# Patient Record
Sex: Male | Born: 1935 | Race: White | Hispanic: No | Smoking: Former smoker
Health system: Southern US, Community
[De-identification: ages and names within clinical notes are randomized; demographics above are authoritative.]

## PROBLEM LIST (undated history)

## (undated) DIAGNOSIS — N200 Calculus of kidney: Secondary | ICD-10-CM

---

## 1958-06-03 DIAGNOSIS — S0990XA Unspecified injury of head, initial encounter: Secondary | ICD-10-CM

## 1958-06-03 HISTORY — DX: Unspecified injury of head, initial encounter: S09.90XA

## 2013-02-13 ENCOUNTER — Emergency Department: Payer: Self-pay | Admitting: Emergency Medicine

## 2013-02-13 LAB — COMPREHENSIVE METABOLIC PANEL
Alkaline Phosphatase: 95 U/L (ref 50–136)
Anion Gap: 6 — ABNORMAL LOW (ref 7–16)
BUN: 27 mg/dL — ABNORMAL HIGH (ref 7–18)
Chloride: 108 mmol/L — ABNORMAL HIGH (ref 98–107)
Co2: 22 mmol/L (ref 21–32)
Creatinine: 2.34 mg/dL — ABNORMAL HIGH (ref 0.60–1.30)
Glucose: 90 mg/dL (ref 65–99)
Osmolality: 277 (ref 275–301)
SGPT (ALT): 23 U/L (ref 12–78)
Sodium: 136 mmol/L (ref 136–145)

## 2013-02-13 LAB — URINALYSIS, COMPLETE
Nitrite: NEGATIVE
Ph: 5 (ref 4.5–8.0)
Protein: 30
Specific Gravity: 1.021 (ref 1.003–1.030)
Squamous Epithelial: NONE SEEN
WBC UR: 16 /HPF (ref 0–5)

## 2013-02-13 LAB — CBC
HGB: 14.5 g/dL (ref 13.0–18.0)
MCHC: 33.8 g/dL (ref 32.0–36.0)
MCV: 92 fL (ref 80–100)
Platelet: 201 10*3/uL (ref 150–440)
RDW: 12.7 % (ref 11.5–14.5)

## 2013-02-15 LAB — URINE CULTURE

## 2017-01-26 ENCOUNTER — Ambulatory Visit
Admission: EM | Admit: 2017-01-26 | Discharge: 2017-01-26 | Disposition: A | Discharge status: Home / Self Care | Payer: Medicare Other | Attending: Family Medicine | Admitting: Family Medicine

## 2017-01-26 ENCOUNTER — Ambulatory Visit (INDEPENDENT_AMBULATORY_CARE_PROVIDER_SITE_OTHER): Payer: Medicare Other

## 2017-01-26 DIAGNOSIS — R05 Cough: Secondary | ICD-10-CM

## 2017-01-26 DIAGNOSIS — J9801 Acute bronchospasm: Secondary | ICD-10-CM

## 2017-01-26 DIAGNOSIS — R059 Cough, unspecified: Secondary | ICD-10-CM

## 2017-01-26 HISTORY — DX: Calculus of kidney: N20.0

## 2017-01-26 MED ORDER — IPRATROPIUM-ALBUTEROL 0.5-2.5 (3) MG/3ML IN SOLN
3.0000 mL | Freq: Once | RESPIRATORY_TRACT | Status: AC
  Administered 2017-01-26: 3 mL via RESPIRATORY_TRACT

## 2017-01-26 MED ORDER — ALBUTEROL SULFATE HFA 108 (90 BASE) MCG/ACT IN AERS: 1.0000 | INHALATION_SPRAY | Freq: Four times a day (QID) | RESPIRATORY_TRACT | 1 refills | Status: DC | PRN

## 2017-01-26 MED ORDER — PREDNISONE 20 MG PO TABS: ORAL_TABLET | ORAL | 0 refills | Status: DC

## 2017-01-26 NOTE — ED Triage Notes (Signed)
Pt with one week of cough with "lots of phlegm" and feeling fatigued. Has been running a fever earlier in the week. Denies pain

## 2017-01-26 NOTE — ED Provider Notes (Signed)
[facility]    CSN: [id number] Arrival date & time: 01/26/17  1041     History   Chief Complaint Chief Complaint  Patient presents with  . Cough    HPI George Waller is a 83 y.o. male.   The history is provided by the patient.  Cough  Associated symptoms: fever and wheezing   URI  Presenting symptoms: congestion, cough and fever   Severity:  Moderate Onset quality:  Sudden Duration:  1 week Timing:  Constant Progression:  Worsening Chronicity:  New Relieved by:  Nothing Ineffective treatments:  OTC medications Associated symptoms: wheezing   Risk factors: being elderly   Risk factors: no chronic cardiac disease, no chronic kidney disease, no chronic respiratory disease, no diabetes mellitus, no immunosuppression, no recent illness, no recent travel and no sick contacts     Past Medical History:  Diagnosis Date  . Kidney stones     There are no active problems to display for this patient.   No past surgical history on file.     Home Medications    Prior to Admission medications   Medication Sig Start Date End Date Taking? Authorizing Provider  acetaminophen (TYLENOL) 500 MG tablet Take 500 mg by mouth every 6 (six) hours as needed.   Yes [provider]  albuterol (PROVENTIL HFA;VENTOLIN HFA) 108 (90 Base) MCG/ACT inhaler Inhale 1-2 puffs into the lungs every 6 (six) hours as needed for wheezing or shortness of breath. 01/26/17   Payton Mccallum, MD  predniSONE (DELTASONE) 20 MG tablet 3 tabs po once day 1, then 2 tabs po qd for 3 days, then 1 tab po qd for 3 days, then half a tab po qd for 2 days 01/26/17   Payton Mccallum, MD    Family History No family history on file.  Social History Social History  Substance Use Topics  . Smoking status: Not on file  . Smokeless tobacco: Not on file  . Alcohol use Not on file     Allergies   Codeine and Sulfa antibiotics   Review of Systems Review of Systems  Constitutional:  Positive for fever.  HENT: Positive for congestion.   Respiratory: Positive for cough and wheezing.      Physical Exam Triage Vital Signs ED Triage Vitals [01/26/17 1105]  Enc Vitals Group     BP (!) 198/78     Pulse Rate (!) 51     Resp 16     Temp 97.6 F (36.4 C)     Temp Source Oral     SpO2 97 %     Weight 172 lb (78 kg)     Height 5\' 9"  (1.753 m)     Head Circumference      Peak Flow      Pain Score      Pain Loc      Pain Edu?      Excl. in GC?    No data found.   Updated Vital Signs BP (!) 198/78 (BP Location: Right Arm)   Pulse (!) 51   Temp 97.6 F (36.4 C) (Oral)   Resp 16   Ht 5\' 9"  (1.753 m)   Wt 172 lb (78 kg)   SpO2 97%   BMI 25.40 kg/m   Visual Acuity Right Eye Distance:   Left Eye Distance:   Bilateral Distance:    Right Eye Near:   Left Eye Near:    Bilateral Near:     Physical Exam  Constitutional: He appears well-developed and well-nourished. No distress.  HENT:  Head: Normocephalic and atraumatic.  Right Ear: Tympanic membrane, external ear and ear canal normal.  Left Ear: Tympanic membrane, external ear and ear canal normal.  Nose: Nose normal.  Mouth/Throat: Uvula is midline, oropharynx is clear and moist and mucous membranes are normal. No oropharyngeal exudate or tonsillar abscesses.  Eyes: Pupils are equal, round, and reactive to light. Conjunctivae and EOM are normal. Right eye exhibits no discharge. Left eye exhibits no discharge. No scleral icterus.  Neck: Normal range of motion. Neck supple. No tracheal deviation present. No thyromegaly present.  Cardiovascular: Normal rate, regular rhythm and normal heart sounds.   Pulmonary/Chest: Effort normal. No stridor. No respiratory distress. He has wheezes (diffuse, bilateral). He has no rales. He exhibits no tenderness.  Lymphadenopathy:    He has no cervical adenopathy.  Neurological: He is alert.  Skin: Skin is warm and dry. No rash noted. He is not diaphoretic.  Nursing note  and vitals reviewed.    UC Treatments / Results  Labs (all labs ordered are listed, but only abnormal results are displayed) Labs Reviewed - No data to display  EKG  EKG Interpretation None       Radiology Dg Chest 2 View  Result Date: 01/26/2017 CLINICAL DATA:  Cough for 1 week. EXAM: CHEST  2 VIEW COMPARISON:  None. FINDINGS: Lungs are clear. No focal airspace disease or pulmonary edema. Heart and mediastinum are within normal limits. Bridging osteophytes throughout the thoracic spine. No large pleural effusions. Minimal vertebral body height loss in a mid thoracic vertebral body. IMPRESSION: No active cardiopulmonary disease. Mild compression deformity in mid thoracic spine of unknown age. Electronically Signed   By: Richarda Overlie M.D.   On: 01/26/2017 11:57    Procedures Procedures (including critical care time)  Medications Ordered in UC Medications  ipratropium-albuterol (DUONEB) 0.5-2.5 (3) MG/3ML nebulizer solution 3 mL (3 mLs Nebulization Given 01/26/17 1134)     Initial Impression / Assessment and Plan / UC Course  I have reviewed the triage vital signs and the nursing notes.  Pertinent labs & imaging results that were available during my care of the patient were reviewed by me and considered in my medical decision making (see chart for details).       Final Clinical Impressions(s) / UC Diagnoses   Final diagnoses:  Cough  Bronchospasm    New Prescriptions Discharge Medication List as of 01/26/2017 12:09 PM    START taking these medications   Details  albuterol (PROVENTIL HFA;VENTOLIN HFA) 108 (90 Base) MCG/ACT inhaler Inhale 1-2 puffs into the lungs every 6 (six) hours as needed for wheezing or shortness of breath., Starting Sun 01/26/2017, Normal    predniSONE (DELTASONE) 20 MG tablet 3 tabs po once day 1, then 2 tabs po qd for 3 days, then 1 tab po qd for 3 days, then half a tab po qd for 2 days, Normal      1. x-ray result (negative chest x-ray) and  diagnosis reviewed with patient and wife; given duoneb tx x 1 with improvement of symptoms 2. rx as per orders above; reviewed possible side effects, interactions, risks and benefits  3. Recommend supportive treatment with rest, fluids 4. Follow-up prn if symptoms worsen or don't improve  Controlled Substance Prescriptions [place] Controlled Substance Registry consulted? Not Applicable   Payton Mccallum, MD 01/26/17 [phone]

## 2018-05-29 ENCOUNTER — Ambulatory Visit
Admission: EM | Admit: 2018-05-29 | Discharge: 2018-05-29 | Disposition: A | Discharge status: Home / Self Care | Payer: Medicare Other | Attending: Family Medicine | Admitting: Family Medicine

## 2018-05-29 ENCOUNTER — Ambulatory Visit (INDEPENDENT_AMBULATORY_CARE_PROVIDER_SITE_OTHER): Payer: Medicare Other

## 2018-05-29 DIAGNOSIS — M25562 Pain in left knee: Secondary | ICD-10-CM

## 2018-05-29 DIAGNOSIS — S82035A Nondisplaced transverse fracture of left patella, initial encounter for closed fracture: Secondary | ICD-10-CM | POA: Diagnosis not present

## 2018-05-29 DIAGNOSIS — M25462 Effusion, left knee: Secondary | ICD-10-CM

## 2018-05-29 DIAGNOSIS — W19XXXA Unspecified fall, initial encounter: Secondary | ICD-10-CM | POA: Diagnosis not present

## 2018-05-29 DIAGNOSIS — R030 Elevated blood-pressure reading, without diagnosis of hypertension: Secondary | ICD-10-CM

## 2018-05-29 MED ORDER — IBUPROFEN 800 MG PO TABS: 800.0000 mg | ORAL_TABLET | Freq: Three times a day (TID) | ORAL | 0 refills | Status: DC

## 2018-05-29 NOTE — ED Triage Notes (Signed)
Pt missed a step yesterday and fell on the road on his left knee now having pain and swelling. Did take a BC. Pain gets worse with pressure.

## 2018-05-29 NOTE — Discharge Instructions (Addendum)
Keep immobilizer on until you are evaluated by orthopedics. You should go to [facility] today. The urgent care opens at 1 pm. Take CD and radiology report with you.

## 2018-05-29 NOTE — ED Provider Notes (Signed)
[facility]    CSN: [id number] Arrival date & time: 05/29/18  1027     History   Chief Complaint Chief Complaint  Patient presents with  . Fall    HPI George CrockerFranklin D Waller is a 84 y.o. male.   History of Present Illness  George Waller is a 84 y.o. male that complains of pain in the anterior aspect of the left knee without radiation after a fall on the evening of 05/28/18. Patient was walking, loss his balance and fell on the concrete landing on the left knee. Patient describes pain as aching and throbbing. Pain severity now is 7 /10. Pain is aggravated by movement and weight bearing. Pain is alleviated by nothing. Symptoms associated with pain include the inability to bear weight on the LLE. He denies any numbness, tingling, weakness, loss of sensation or loss of motion. The patient denies other injuries.  Care prior to arrival consisted of nothing.             Past Medical History:  Diagnosis Date  . Kidney stones     There are no active problems to display for this patient.   History reviewed. No pertinent surgical history.     Home Medications    Prior to Admission medications   Medication Sig Start Date End Date Taking? Authorizing Provider  acetaminophen (TYLENOL) 500 MG tablet Take 500 mg by mouth every 6 (six) hours as needed.    [provider]  albuterol (PROVENTIL HFA;VENTOLIN HFA) 108 (90 Base) MCG/ACT inhaler Inhale 1-2 puffs into the lungs every 6 (six) hours as needed for wheezing or shortness of breath. 01/26/17   George Waller, [place], MD  ibuprofen (ADVIL,MOTRIN) 800 MG tablet Take 1 tablet (800 mg total) by mouth 3 (three) times daily. 05/29/18   George Waller, Korinna Tat, FNP  predniSONE (DELTASONE) 20 MG tablet 3 tabs po once day 1, then 2 tabs po qd for 3 days, then 1 tab po qd for 3 days, then half a tab po qd for 2 days 01/26/17   George Waller, [place], MD    Family History Family History  Problem Relation Age of Onset  . Cancer Mother    . Thyroid disease Mother   . Diabetes Mother   . Cancer Father     Social History Social History   Tobacco Use  . Smoking status: Never Smoker  . Smokeless tobacco: Never Used  Substance Use Topics  . Alcohol use: Never    Frequency: Never  . Drug use: Never     Allergies   Codeine and Sulfa antibiotics   Review of Systems Review of Systems  Constitutional: Negative.   Respiratory: Negative.   Cardiovascular: Negative.   Musculoskeletal: Positive for gait problem. Negative for back pain and neck pain.       Left knee pain and swelling   Neurological: Negative for dizziness.  All other systems reviewed and are negative.    Physical Exam Triage Vital Signs ED Triage Vitals  Enc Vitals Group     BP 05/29/18 1052 (!) 188/105     Pulse Rate 05/29/18 1052 (!) 54     Resp 05/29/18 1052 18     Temp 05/29/18 1052 (!) 97.5 F (36.4 C)     Temp Source 05/29/18 1052 Oral     SpO2 05/29/18 1052 97 %     Weight 05/29/18 1055 175 lb (79.4 kg)     Height 05/29/18 1055 5\' 9"  (1.753 m)     Head  Circumference --      Peak Flow --      Pain Score 05/29/18 1054 7     Pain Loc --      Pain Edu? --      Excl. in GC? --    No data found.  Updated Vital Signs BP (!) 175/101 (BP Location: Left Arm)   Pulse (!) 53   Temp (!) 97.5 F (36.4 C) (Oral)   Resp 18   Ht 5\' 9"  (1.753 m)   Wt 175 lb (79.4 kg)   SpO2 97%   BMI 25.84 kg/m   Visual Acuity Right Eye Distance:   Left Eye Distance:   Bilateral Distance:    Right Eye Near:   Left Eye Near:    Bilateral Near:     Physical Exam Vitals signs reviewed.  Constitutional:      Appearance: Normal appearance.  HENT:     Head: Normocephalic.  Neck:     Musculoskeletal: Normal range of motion and neck supple. No muscular tenderness.  Cardiovascular:     Rate and Rhythm: Normal rate.     Pulses: Normal pulses.  Pulmonary:     Effort: Pulmonary effort is normal.     Breath sounds: Normal breath sounds.    Musculoskeletal:     Left knee: He exhibits decreased range of motion, swelling and effusion. He exhibits no deformity and no erythema. Tenderness found.  Skin:    General: Skin is warm and dry.     Findings: No bruising.       Neurological:     General: No focal deficit present.     Mental Status: He is alert and oriented to person, place, and time.  Psychiatric:        Mood and Affect: Mood normal.        Behavior: Behavior normal.      UC Treatments / Results  Labs (all labs ordered are listed, but only abnormal results are displayed) Labs Reviewed - No data to display  EKG None  Radiology Dg Knee Complete 4 Views Left  Result Date: 05/29/2018 CLINICAL DATA:  The patient suffered a blow to the left knee last night when he fell on stairs. Initial encounter. EXAM: LEFT KNEE - COMPLETE 4+ VIEW COMPARISON:  None. FINDINGS: The patient has a transverse fracture through the mid pole of the patella. Fracture fragments are slightly distracted. No other fracture is identified. Marked soft tissue swelling anterior to the patella is noted. The patient also has a large joint effusion. Mild degenerative change is present about the knee. IMPRESSION: Minimally distracted transverse fracture mid pole of the patella. Large prepatellar soft tissue swelling and large knee joint effusion. Mild osteoarthritis about the knee. Electronically Signed   By: Drusilla Kannerhomas  Dalessio M.D.   On: 05/29/2018 11:31    Procedures Procedures (including critical care time)  Medications Ordered in UC Medications - No data to display  Initial Impression / Assessment and Plan / UC Course  I have reviewed the triage vital signs and the nursing notes.  Pertinent labs & imaging results that were available during my care of the patient were reviewed by me and considered in my medical decision making (see chart for details).     84 yo male presenting with left knee pain s/p mechanical fall on 05/28/18. X-ray reveals  minimally distracted transverse fracture mid pole of the patella with large prepatellar soft tissue swelling and large knee joint effusion. Patient placed in knee immobilizer. Instructed  to go to [facility] today for further evaluation and management. Nonweightbearing until ortho follow-up. CD of image and radiology report given to patient/wife to take with them to orthopedics. Patient was also noted to have an elevated blood pressure. No history of hypertension. Patient denies chest pain, shortness of breath, dizziness, headache or blurred vision. Advised to follow-up with PCP in 3 days for BP recheck. Discussed indications for immediate ED follow-up regarding elevated BP.   Today's evaluation has revealed no signs of a dangerous process. Discussed diagnosis with patient. Patient aware of their diagnosis, possible red flag symptoms to watch out for and need for close follow up. Patient understands verbal and written discharge instructions. Patient comfortable with plan and disposition.  Patient has a clear mental status at this time, good insight into illness (after discussion and teaching) and has clear judgment to make decisions regarding their care.  Documentation was completed with the aid of voice recognition software. Transcription may contain typographical errors.  Final Clinical Impressions(s) / UC Diagnoses   Final diagnoses:  Closed nondisplaced transverse fracture of left patella, initial encounter  Effusion of left knee  Fall, initial encounter  Elevated BP without diagnosis of hypertension     Discharge Instructions     Keep immobilizer on until you are evaluated by orthopedics. You should go to [facility] today. The urgent care opens at 1 pm. Take CD and radiology report with you.      ED Prescriptions    Medication Sig Dispense Auth. Provider   ibuprofen (ADVIL,MOTRIN) 800 MG tablet Take 1 tablet (800 mg total) by mouth 3 (three) times daily. 21 tablet George Idol, FNP     Controlled Substance Prescriptions [place] Controlled Substance Registry consulted? Not Applicable   George Idol, [place] 05/29/18 1202

## 2019-08-21 ENCOUNTER — Ambulatory Visit: Payer: Medicare Other | Attending: Internal Medicine

## 2019-08-21 DIAGNOSIS — Z23 Encounter for immunization: Secondary | ICD-10-CM

## 2019-08-21 NOTE — Progress Notes (Signed)
   Covid-19 Vaccination Clinic  Name:  Rhyland Hinderliter    MRN: [id number] DOB: 03-09-1933  08/21/2019  Mr. Hemenway was observed post Covid-19 immunization for 15 minutes without incident. He was provided with Vaccine Information Sheet and instruction to access the V-Safe system.   Mr. Roam was instructed to call 911 with any severe reactions post vaccine: Marland Kitchen Difficulty breathing  . Swelling of face and throat  . A fast heartbeat  . A bad rash all over body  . Dizziness and weakness   Immunizations Administered    Name Date Dose VIS Date Route   Pfizer COVID-19 Vaccine 08/21/2019  1:02 PM 0.3 mL 05/14/2019 Intramuscular   Manufacturer: [employer], [employer]   Lot: [id number]   NDC: [address]-3

## 2019-09-11 ENCOUNTER — Ambulatory Visit: Payer: Medicare Other | Attending: Internal Medicine

## 2019-09-11 DIAGNOSIS — Z23 Encounter for immunization: Secondary | ICD-10-CM

## 2019-09-11 NOTE — Progress Notes (Signed)
   Covid-19 Vaccination Clinic  Name:  George Waller    MRN: [id number] DOB: 1933-05-02  09/11/2019  George Waller was observed post Covid-19 immunization for 15 minutes without incident. He was provided with Vaccine Information Sheet and instruction to access the V-Safe system.   George Waller was instructed to call 911 with any severe reactions post vaccine: Marland Kitchen Difficulty breathing  . Swelling of face and throat  . A fast heartbeat  . A bad rash all over body  . Dizziness and weakness   Immunizations Administered    Name Date Dose VIS Date Route   Pfizer COVID-19 Vaccine 09/11/2019  1:28 PM 0.3 mL 05/14/2019 Intramuscular   Manufacturer: [employer], [employer]   Lot: [phone] [id number]   NDC: [address]-6

## 2021-04-04 ENCOUNTER — Other Ambulatory Visit: Payer: Self-pay

## 2021-04-04 ENCOUNTER — Ambulatory Visit: Payer: Medicare Other | Admitting: Urology

## 2021-04-04 ENCOUNTER — Encounter: Payer: Self-pay | Admitting: Urology

## 2021-04-04 VITALS — BP 130/70 | HR 72 | Ht 68.0 in | Wt 175.0 lb

## 2021-04-04 DIAGNOSIS — N401 Enlarged prostate with lower urinary tract symptoms: Secondary | ICD-10-CM | POA: Diagnosis not present

## 2021-04-04 DIAGNOSIS — R351 Nocturia: Secondary | ICD-10-CM | POA: Diagnosis not present

## 2021-04-04 DIAGNOSIS — R350 Frequency of micturition: Secondary | ICD-10-CM | POA: Diagnosis not present

## 2021-04-04 LAB — URINALYSIS, COMPLETE
Bilirubin, UA: NEGATIVE
Ketones, UA: NEGATIVE
Leukocytes,UA: NEGATIVE
Nitrite, UA: NEGATIVE
Protein,UA: NEGATIVE
RBC, UA: NEGATIVE
Specific Gravity, UA: 1.025 (ref 1.005–1.030)
Urobilinogen, Ur: 0.2 mg/dL (ref 0.2–1.0)
pH, UA: 5.5 (ref 5.0–7.5)

## 2021-04-04 LAB — BLADDER SCAN AMB NON-IMAGING: Scan Result: 12

## 2021-04-04 MED ORDER — SILODOSIN 8 MG PO CAPS: 8.0000 mg | ORAL_CAPSULE | Freq: Every day | ORAL | 0 refills | Status: DC

## 2021-04-04 NOTE — Progress Notes (Addendum)
   04/04/2021 9:36 AM   [name] 1932-06-09 [id number]  Referring provider: No referring provider defined for this encounter.  Chief Complaint  Patient presents with   Urinary Frequency    HPI: George Waller is an 87 y.o. male self-referred for evaluation of urinary frequency.  Duration ~ 2 years, progressively worsening Most bothersome symptom is nocturia x4 He also has daytime frequency; no bothersome obstructive symptoms IPSS 12/35 Denies history sleep apnea or snoring Denies dysuria, gross hematuria No history UTI Denies flank, abdominal or pelvic pain     PMH: Past Medical History:  Diagnosis Date   Kidney stones     Surgical History: No past surgical history on file.  Home Medications:  Allergies as of 04/04/2021       Reactions   Codeine Swelling   Sulfa Antibiotics Itching        Medication List        Accurate as of April 04, 2021  9:36 AM. If you have any questions, ask your nurse or doctor.          STOP taking these medications    ibuprofen 800 MG tablet Commonly known as: ADVIL Stopped by: Riki Altes, MD   predniSONE 20 MG tablet Commonly known as: DELTASONE Stopped by: Riki Altes, MD       TAKE these medications    acetaminophen 500 MG tablet Commonly known as: TYLENOL Take 500 mg by mouth every 6 (six) hours as needed.   albuterol 108 (90 Base) MCG/ACT inhaler Commonly known as: VENTOLIN HFA Inhale 1-2 puffs into the lungs every 6 (six) hours as needed for wheezing or shortness of breath.        Allergies:  Allergies  Allergen Reactions   Codeine Swelling   Sulfa Antibiotics Itching    Family History: Family History  Problem Relation Age of Onset   Cancer Mother    Thyroid disease Mother    Diabetes Mother    Cancer Father     Social History:  reports that he has never smoked. He has never used smokeless tobacco. He reports that he does not drink alcohol and does not use  drugs.   Physical Exam: BP 130/70   Pulse 72   Ht 5\' 8"  (1.727 m)   Wt 175 lb (79.4 kg)   BMI 26.61 kg/m   Constitutional:  Alert and oriented, No acute distress. HEENT: [place] AT, moist mucus membranes.  Trachea midline, no masses. Cardiovascular: No clubbing, cyanosis, or edema. Respiratory: Normal respiratory effort, no increased work of breathing. GU: Reducible left inguinal hernia.  Prostate 60 g, smooth without nodules Skin: No rashes, bruises or suspicious lesions. Neurologic: Grossly intact, no focal deficits, moving all 4 extremities. Psychiatric: Normal mood and affect.   Assessment & Plan:    1.  BPH with urinary frequency Urinalysis today clear Bladder scan PVR 12 mL Symptoms modest enough that he desires a trial of medical management Initial trial silodosin 8 mg daily He indicated he would call back regarding efficacy and we will have a follow-up visit If no improvement recommend trial of a beta 3 agonist  2.  Nocturia As above Other etiologies of nocturia were discussed including sleep apnea and nocturnal polyuria   , MD  [facility] [address] [place], [place] [phone]

## 2021-05-01 ENCOUNTER — Other Ambulatory Visit: Payer: Self-pay | Admitting: Urology

## 2021-05-17 ENCOUNTER — Ambulatory Visit: Payer: Medicare Other | Admitting: Urology

## 2021-06-04 ENCOUNTER — Other Ambulatory Visit: Payer: Self-pay | Admitting: Urology

## 2021-06-15 ENCOUNTER — Other Ambulatory Visit: Payer: Self-pay

## 2021-06-15 ENCOUNTER — Encounter: Payer: Self-pay | Admitting: Urology

## 2021-06-15 ENCOUNTER — Ambulatory Visit: Payer: Medicare Other | Admitting: Urology

## 2021-06-15 VITALS — BP 165/67 | HR 60 | Wt 165.0 lb

## 2021-06-15 DIAGNOSIS — R350 Frequency of micturition: Secondary | ICD-10-CM | POA: Diagnosis not present

## 2021-06-15 DIAGNOSIS — R351 Nocturia: Secondary | ICD-10-CM

## 2021-06-15 DIAGNOSIS — R399 Unspecified symptoms and signs involving the genitourinary system: Secondary | ICD-10-CM | POA: Diagnosis not present

## 2021-06-15 LAB — MICROSCOPIC EXAMINATION
Bacteria, UA: NONE SEEN
Epithelial Cells (non renal): NONE SEEN /hpf (ref 0–10)
RBC, Urine: NONE SEEN /hpf (ref 0–2)

## 2021-06-15 LAB — URINALYSIS, COMPLETE
Bilirubin, UA: NEGATIVE
Glucose, UA: NEGATIVE
Ketones, UA: NEGATIVE
Leukocytes,UA: NEGATIVE
Nitrite, UA: NEGATIVE
Protein,UA: NEGATIVE
RBC, UA: NEGATIVE
Specific Gravity, UA: 1.025 (ref 1.005–1.030)
Urobilinogen, Ur: 1 mg/dL (ref 0.2–1.0)
pH, UA: 5 (ref 5.0–7.5)

## 2021-06-15 LAB — BLADDER SCAN AMB NON-IMAGING

## 2021-06-15 MED ORDER — MIRABEGRON ER 50 MG PO TB24: 50.0000 mg | ORAL_TABLET | Freq: Every day | ORAL | 0 refills | Status: DC

## 2021-06-15 NOTE — Progress Notes (Signed)
° °  06/15/2021 11:36 AM   George Waller August 10, 1932 [id number]  Referring provider: No referring provider defined for this encounter.  Chief Complaint  Patient presents with   Urinary Frequency    HPI: 88 y.o. male who presents for follow-up visit.  Initially seen 04/04/2021 with complaints of nocturia x4, daytime frequency Trial silodosin 8 mg daily No significant improvement on slow dosing.  Daytime frequency at every 2 hours.  Noted some initial improvement in his nocturia down to 2 times nightly but then worsened and currently at 3-4 No dysuria or gross hematuria Denies flank, abdominal or pelvic pain   PMH: Past Medical History:  Diagnosis Date   Kidney stones     Surgical History: No past surgical history on file.  Home Medications:  Allergies as of 06/15/2021       Reactions   Codeine Swelling   Sulfa Antibiotics Itching        Medication List        Accurate as of June 15, 2021 11:36 AM. If you have any questions, ask your nurse or doctor.          acetaminophen 500 MG tablet Commonly known as: TYLENOL Take 500 mg by mouth every 6 (six) hours as needed.   albuterol 108 (90 Base) MCG/ACT inhaler Commonly known as: VENTOLIN HFA Inhale 1-2 puffs into the lungs every 6 (six) hours as needed for wheezing or shortness of breath.   silodosin 8 MG Caps capsule Commonly known as: RAPAFLO TAKE 1 CAPSULE BY MOUTH DAILY WITH BREAKFAST.        Allergies:  Allergies  Allergen Reactions   Codeine Swelling   Sulfa Antibiotics Itching    Family History: Family History  Problem Relation Age of Onset   Cancer Mother    Thyroid disease Mother    Diabetes Mother    Cancer Father     Social History:  reports that he has never smoked. He has never used smokeless tobacco. He reports that he does not drink alcohol and does not use drugs.   Physical Exam: BP (!) 165/67    Pulse 60    Wt 165 lb (74.8 kg)    BMI 25.09 kg/m    Constitutional:  Alert and oriented, No acute distress. Psychiatric: Normal mood and affect.  Laboratory Data:  Urinalysis Dipstick/microscopy negative   Assessment & Plan:    1.  Lower urinary tract symptoms Bothersome storage related voiding symptoms No significant improvement on silodosin Bladder scan PVR 0 mL Trial Myrbetriq 50 mg daily Call back 4 weeks regarding efficacy   Abbie Sons, [place] [address], [place],  [address] [phone]

## 2021-07-12 ENCOUNTER — Other Ambulatory Visit: Payer: Self-pay

## 2021-07-12 MED ORDER — MIRABEGRON ER 50 MG PO TB24: 50.0000 mg | ORAL_TABLET | Freq: Every day | ORAL | 0 refills | Status: DC

## 2021-07-12 NOTE — Telephone Encounter (Signed)
Pt called stating myrbetriq 50 mg samples are helping and would like a prescription sent to pharmacy. Medication was sent to [facility]. OK per Dr. Lonna Cobb.

## 2021-07-16 ENCOUNTER — Telehealth: Payer: Self-pay

## 2021-07-16 DIAGNOSIS — R350 Frequency of micturition: Secondary | ICD-10-CM

## 2021-07-16 MED ORDER — MIRABEGRON ER 50 MG PO TB24: 50.0000 mg | ORAL_TABLET | Freq: Every day | ORAL | 11 refills | Status: DC

## 2021-07-16 NOTE — Telephone Encounter (Signed)
Incoming call from pt's spouse stating she was expecting RX to be sent in for Myrbetriq. It appears RX was sent as sample, RX changed to normal class and sent to [facility] in [place].

## 2021-09-01 DIAGNOSIS — G459 Transient cerebral ischemic attack, unspecified: Secondary | ICD-10-CM

## 2021-09-01 HISTORY — DX: Transient cerebral ischemic attack, unspecified: G45.9

## 2021-09-03 ENCOUNTER — Observation Stay
Admission: EM | Admit: 2021-09-03 | Discharge: 2021-09-04 | Disposition: A | Discharge status: Home / Self Care | Payer: Medicare Other | Attending: Family Medicine | Admitting: Family Medicine

## 2021-09-03 ENCOUNTER — Other Ambulatory Visit: Payer: Self-pay

## 2021-09-03 ENCOUNTER — Encounter: Payer: Self-pay | Admitting: Emergency Medicine

## 2021-09-03 ENCOUNTER — Emergency Department: Payer: Medicare Other

## 2021-09-03 DIAGNOSIS — R2681 Unsteadiness on feet: Secondary | ICD-10-CM | POA: Insufficient documentation

## 2021-09-03 DIAGNOSIS — Z79899 Other long term (current) drug therapy: Secondary | ICD-10-CM | POA: Diagnosis not present

## 2021-09-03 DIAGNOSIS — I6782 Cerebral ischemia: Secondary | ICD-10-CM | POA: Diagnosis not present

## 2021-09-03 DIAGNOSIS — R2689 Other abnormalities of gait and mobility: Secondary | ICD-10-CM | POA: Diagnosis present

## 2021-09-03 DIAGNOSIS — R269 Unspecified abnormalities of gait and mobility: Secondary | ICD-10-CM

## 2021-09-03 DIAGNOSIS — N1831 Chronic kidney disease, stage 3a: Secondary | ICD-10-CM | POA: Diagnosis not present

## 2021-09-03 DIAGNOSIS — Z87442 Personal history of urinary calculi: Secondary | ICD-10-CM | POA: Diagnosis not present

## 2021-09-03 DIAGNOSIS — I517 Cardiomegaly: Secondary | ICD-10-CM | POA: Insufficient documentation

## 2021-09-03 DIAGNOSIS — I639 Cerebral infarction, unspecified: Secondary | ICD-10-CM

## 2021-09-03 DIAGNOSIS — H532 Diplopia: Secondary | ICD-10-CM | POA: Diagnosis present

## 2021-09-03 DIAGNOSIS — R42 Dizziness and giddiness: Secondary | ICD-10-CM | POA: Insufficient documentation

## 2021-09-03 DIAGNOSIS — N179 Acute kidney failure, unspecified: Secondary | ICD-10-CM | POA: Diagnosis present

## 2021-09-03 LAB — COMPREHENSIVE METABOLIC PANEL
ALT: 17 U/L (ref 0–44)
AST: 19 U/L (ref 15–41)
Albumin: 3.9 g/dL (ref 3.5–5.0)
Alkaline Phosphatase: 93 U/L (ref 38–126)
Anion gap: 6 (ref 5–15)
BUN: 24 mg/dL — ABNORMAL HIGH (ref 8–23)
CO2: 25 mmol/L (ref 22–32)
Calcium: 9.1 mg/dL (ref 8.9–10.3)
Chloride: 109 mmol/L (ref 98–111)
Creatinine, Ser: 1.39 mg/dL — ABNORMAL HIGH (ref 0.61–1.24)
GFR, Estimated: 49 mL/min — ABNORMAL LOW (ref >60)
Glucose, Bld: 110 mg/dL — ABNORMAL HIGH (ref 70–99)
Potassium: 4.2 mmol/L (ref 3.5–5.1)
Sodium: 140 mmol/L (ref 135–145)
Total Bilirubin: 0.7 mg/dL (ref 0.3–1.2)
Total Protein: 7.7 g/dL (ref 6.5–8.1)

## 2021-09-03 LAB — DIFFERENTIAL
Abs Immature Granulocytes: 0.02 10*3/uL (ref 0.00–0.07)
Basophils Absolute: 0.1 10*3/uL (ref 0.0–0.1)
Basophils Relative: 1 %
Eosinophils Absolute: 1.1 10*3/uL — ABNORMAL HIGH (ref 0.0–0.5)
Eosinophils Relative: 14 %
Immature Granulocytes: 0 %
Lymphocytes Relative: 29 %
Lymphs Abs: 2.3 10*3/uL (ref 0.7–4.0)
Monocytes Absolute: 0.6 10*3/uL (ref 0.1–1.0)
Monocytes Relative: 7 %
Neutro Abs: 3.8 10*3/uL (ref 1.7–7.7)
Neutrophils Relative %: 49 %

## 2021-09-03 LAB — CBC
HCT: 49.6 % (ref 39.0–52.0)
HCT: 49.6 % (ref 39.0–52.0)
Hemoglobin: 16 g/dL (ref 13.0–17.0)
Hemoglobin: 16.2 g/dL (ref 13.0–17.0)
MCH: 30 pg (ref 26.0–34.0)
MCH: 30.3 pg (ref 26.0–34.0)
MCHC: 32.3 g/dL (ref 30.0–36.0)
MCHC: 32.7 g/dL (ref 30.0–36.0)
MCV: 92.9 fL (ref 80.0–100.0)
MCV: 92.7 fL (ref 80.0–100.0)
Platelets: 235 10*3/uL (ref 150–400)
Platelets: 235 10*3/uL (ref 150–400)
RBC: 5.34 MIL/uL (ref 4.22–5.81)
RBC: 5.35 MIL/uL (ref 4.22–5.81)
RDW: 12.4 % (ref 11.5–15.5)
RDW: 12.6 % (ref 11.5–15.5)
WBC: 7.9 10*3/uL (ref 4.0–10.5)
WBC: 9.2 10*3/uL (ref 4.0–10.5)
nRBC: 0 % (ref 0.0–0.2)
nRBC: 0 % (ref 0.0–0.2)

## 2021-09-03 LAB — CREATININE, SERUM
Creatinine, Ser: 1.46 mg/dL — ABNORMAL HIGH (ref 0.61–1.24)
GFR, Estimated: 47 mL/min — ABNORMAL LOW (ref >60)

## 2021-09-03 LAB — PROTIME-INR
INR: 1.1 (ref 0.8–1.2)
Prothrombin Time: 14.6 seconds (ref 11.4–15.2)

## 2021-09-03 LAB — TSH: TSH: 2.693 u[IU]/mL (ref 0.350–4.500)

## 2021-09-03 LAB — APTT: aPTT: 35 seconds (ref 24–36)

## 2021-09-03 LAB — T4, FREE: Free T4: 1.04 ng/dL (ref 0.61–1.12)

## 2021-09-03 MED ORDER — ASPIRIN 300 MG RE SUPP: 300.0000 mg | Freq: Every day | RECTAL | Status: DC

## 2021-09-03 MED ORDER — SODIUM CHLORIDE 0.9 % IV SOLN: INTRAVENOUS | Status: DC

## 2021-09-03 MED ORDER — ACETAMINOPHEN 650 MG RE SUPP: 650.0000 mg | RECTAL | Status: DC | PRN

## 2021-09-03 MED ORDER — ACETAMINOPHEN 325 MG PO TABS: 650.0000 mg | ORAL_TABLET | ORAL | Status: DC | PRN

## 2021-09-03 MED ORDER — ACETAMINOPHEN 160 MG/5ML PO SOLN
650.0000 mg | ORAL | Status: DC | PRN
  Filled 2021-09-03: qty 20.3

## 2021-09-03 MED ORDER — HEPARIN SODIUM (PORCINE) 5000 UNIT/ML IJ SOLN
5000.0000 [IU] | Freq: Three times a day (TID) | INTRAMUSCULAR | Status: DC
  Administered 2021-09-03 – 2021-09-04 (×2): 5000 [IU] via SUBCUTANEOUS
  Filled 2021-09-03 (×3): qty 1

## 2021-09-03 MED ORDER — GADOBUTROL 1 MMOL/ML IV SOLN
7.0000 mL | Freq: Once | INTRAVENOUS | Status: AC | PRN
  Administered 2021-09-03: 7 mL via INTRAVENOUS

## 2021-09-03 MED ORDER — ASPIRIN EC 325 MG PO TBEC
325.0000 mg | DELAYED_RELEASE_TABLET | Freq: Every day | ORAL | Status: DC
  Administered 2021-09-03 – 2021-09-04 (×2): 325 mg via ORAL
  Filled 2021-09-03 (×2): qty 1

## 2021-09-03 MED ORDER — STROKE: EARLY STAGES OF RECOVERY BOOK: Freq: Once | Status: DC

## 2021-09-03 NOTE — ED Notes (Signed)
Pt given snacks, OK per MD Allena Katz ?

## 2021-09-03 NOTE — ED Provider Notes (Signed)
? ?[facility] ?Provider Note ? ? ? Event Date/Time  ? First MD Initiated Contact with Patient 09/03/21 1153   ?  (approximate) ? ? ?History  ? ?Chief Complaint ?Visual Field Change and Dizziness ? ? ?HPI ? ?George Waller is a 88 y.o. male with past medical history of kidney stones who presents to the ED complaining of double vision.  Patient reports that around 11:00 yesterday morning he had sudden onset of double vision.  He states that it feels like he begins seeing 2 of things when he looks to the right, but he denies any blurry vision or visual field deficits.  Along with this, he states that he has been very unsteady on his feet while walking since yesterday.  He has not noticed any speech changes, facial droop, or extremity numbness or weakness.  He states he has longstanding issues with coordination, but it is not typically this severe.  He has otherwise been feeling well with no fevers, cough, chest pain, shortness of breath, nausea, or vomiting. ?  ? ? ?Physical Exam  ? ?Triage Vital Signs: ?ED Triage Vitals  ?Enc Vitals Group  ?   BP 09/03/21 1110 (!) 154/76  ?   Pulse Rate 09/03/21 1110 (!) 47  ?   Resp 09/03/21 1110 14  ?   Temp 09/03/21 1110 (!) 96.3 ?F (35.7 ?C)  ?   Temp Source 09/03/21 1110 Axillary  ?   SpO2 09/03/21 1110 98 %  ?   Weight 09/03/21 1112 165 lb (74.8 kg)  ?   Height 09/03/21 1112 5\' 8"  (1.727 m)  ?   Head Circumference --   ?   Peak Flow --   ?   Pain Score 09/03/21 1112 4  ?   Pain Loc --   ?   Pain Edu? --   ?   Excl. in GC? --   ? ? ?Most recent vital signs: ?Vitals:  ? 09/03/21 1140 09/03/21 1356  ?BP: (!) 155/84 (!) 143/71  ?Pulse: (!) 44 (!) 47  ?Resp: 17 14  ?Temp:    ?SpO2: 97% 100%  ? ? ?Constitutional: Alert and oriented. ?Eyes: Conjunctivae are normal.  Pupils equal, round, and reactive to light bilaterally.  Disconjugate gaze noted. ?Head: Atraumatic. ?Nose: No congestion/rhinnorhea. ?Mouth/Throat: Mucous membranes are moist.   ?Cardiovascular: Normal rate, regular rhythm. Grossly normal heart sounds.  2+ radial pulses bilaterally. ?Respiratory: Normal respiratory effort.  No retractions. Lungs CTAB. ?Gastrointestinal: Soft and nontender. No distention. ?Musculoskeletal: No lower extremity tenderness nor edema.  ?Neurologic:  Normal speech and language. No gross focal neurologic deficits are appreciated. ? ? ? ?ED Results / Procedures / Treatments  ? ?Labs ?(all labs ordered are listed, but only abnormal results are displayed) ?Labs Reviewed  ?DIFFERENTIAL - Abnormal; Notable for the following components:  ?    Result Value  ? Eosinophils Absolute 1.1 (*)   ? All other components within normal limits  ?COMPREHENSIVE METABOLIC PANEL - Abnormal; Notable for the following components:  ? Glucose, Bld 110 (*)   ? BUN 24 (*)   ? Creatinine, Ser 1.39 (*)   ? GFR, Estimated 49 (*)   ? All other components within normal limits  ?PROTIME-INR  ?APTT  ?CBC  ?CBG MONITORING, ED  ? ? ? ?EKG ? ?ED ECG REPORT ?11/03/21, the attending physician, personally viewed and interpreted this ECG. ? ? Date: 09/03/2021 ? EKG Time: 11:10 ? Rate: 46 ? Rhythm: sinus bradycardia ?  Axis: Normal ? Intervals:none ? ST&T Change: None ? ?RADIOLOGY ?CT head reviewed by me with no hemorrhage or midline shift. ? ?PROCEDURES: ? ?Critical Care performed: No ? ?Procedures ? ? ?MEDICATIONS ORDERED IN ED: ?Medications  ?gadobutrol (GADAVIST) 1 MMOL/ML injection 7 mL (7 mLs Intravenous Contrast Given 09/03/21 1451)  ? ? ? ?IMPRESSION / MDM / ASSESSMENT AND PLAN / ED COURSE  ?I reviewed the triage vital signs and the nursing notes. ?             ?               ? ?88 y.o. male with past medical history of kidney stones presents to the ED with double vision when looking right along with unsteady gait since 11:00 yesterday. ? ?Differential diagnosis includes, but is not limited to, stroke, intracranial hemorrhage, intracranial mass, vertigo, electrolyte abnormality. ? ?Patient  is nontoxic-appearing and in no acute distress, vital signs are unremarkable.  He has no focal deficits in his extremities, but appears to have disconjugate gaze, more prominent when looking to the right side.  CT head reviewed by me and is unremarkable, we will need to further assess with MRI to rule out acute stroke.  CBC is unremarkable with no anemia or leukocytosis, LFTs within normal limits, BMP with mildly elevated creatinine but this is improved from previous. ? ?Case discussed with Dr. Iver Nestle of neurology, who recommends adding MRA of head and neck in addition to MRI brain.  She also recommends admission to hospitalist service regardless of MRI results.  In further discussion with patient, he is extremely hesitant to proceed with admission and prefers to speak with family first.  Admission put on hold for now and patient turned over to oncoming provider pending MRI results and further neurology evaluation. ? ?  ? ? ?FINAL CLINICAL IMPRESSION(S) / ED DIAGNOSES  ? ?Final diagnoses:  ?Diplopia  ? ? ? ?Rx / DC Orders  ? ?ED Discharge Orders   ? ? None  ? ?  ? ? ? ?Note:  This document was prepared using Dragon voice recognition software and may include unintentional dictation errors. ?  ?Chesley Noon, MD ?09/03/21 1518 ? ?

## 2021-09-03 NOTE — ED Notes (Signed)
Pt family requesting a snack for pt. Message sent to MD ?

## 2021-09-03 NOTE — ED Triage Notes (Signed)
Since about 11a yesterday has had problems with vision.  Says when he looks to the right it is double vision, but straight and left are nomral. Normally has balance problems, but they are worse now.  He is alert and in nad.   ?

## 2021-09-03 NOTE — ED Notes (Signed)
Pt walks with steady gait from ED, NAD. A/ox4, pt states he noticed he had double vision and blurred vision starting yesterday and family states his balance has also been worse than usual. Pt speaking with clear speech, NIHSS 0, only c/o "some blurry vision and double vision" when looking to the right.  ?

## 2021-09-03 NOTE — ED Provider Notes (Signed)
Discussed the case with neurology who still recommends admission even though MRIs are negative given concerning finding that there is still probably an MLF lesion that he needs full stroke work-up and patient ? ? ?  ?Vanessa Wach[place]reague, MD ?09/03/21 1738 ? ?

## 2021-09-03 NOTE — ED Triage Notes (Signed)
FIRST NURSE NOTE:  ?Pt via POV from [facility]. Pt c/o loss of balance, headache, double vision, blurred vision since noon yesterday. Nurse states that L eye is more sluggish. Pt is A&Ox4 and NAD.  ?

## 2021-09-03 NOTE — Consult Note (Signed)
Neurology Consultation ?Reason for Consult: New onset diplopia ?Requesting Physician: Blake Divine ? ?CC: Diplopia ? ?History is obtained from: Patient, family at bedside, chart review ? ?HPI: George Waller is a 88 y.o. male with a past medical history significant for limited access of medical care. ? ?He was in his usual state of health yesterday when he suddenly stumbled for unclear reasons.  Subsequently he noted he had diplopia that is worst on right gaze.  He also developed left eye ptosis and substantial worsening of some baseline gait difficulties and coordination.  He did initially have a headache at the time of this event, but the headache has since resolved.  He notes that his double vision is also resolving, although his family states that he is just saying that because he does not want to be admitted to the hospital ? 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 ? ? ?LKW: 11 AM 4/2 ?tPA given?: No, due to out of the window ?Premorbid modified rankin scale:  ?    1 - No significant disability. Able to carry out all usual activities, despite some symptoms. ? ?ROS: All other review of systems was negative except as noted in the HPI.  ? ?Past Medical History:  ?Diagnosis Date  ? Kidney stones   ? ?History reviewed. No pertinent surgical history. ? ? ?Family History  ?Problem Relation Age of Onset  ? Cancer Mother   ? Thyroid disease Mother   ? Diabetes Mother   ? Cancer Father   ? ?Social History:  reports that he has never smoked. He has never used smokeless tobacco. He reports that he does not currently use alcohol. He reports that he does not use drugs. ? ? ?Exam: ?Current vital signs: ?BP (!) 170/88 (BP Location: Left Arm)   Pulse (!) 50   Temp (!) 96.3 ?F (35.7 ?C) (Axillary)   Resp 16   Ht 5\' 8"  (1.727 m)   Wt 74.8 kg   SpO2 97%   BMI 25.09 kg/m?  ?Vital signs in last 24 hours: ?Temp:  [96.3 ?F (35.7 ?C)] 96.3 ?F (35.7 ?C)  (04/03 1110) ?Pulse Rate:  [44-49] 49 (04/03 1515) ?Resp:  [14-17] 16 (04/03 1515) ?BP: (143-158)/(71-84) 158/76 (04/03 1515) ?SpO2:  [95 %-100 %] 95 % (04/03 1515) ?Weight:  [74.8 kg] 74.8 kg (04/03 1112) ? ? ?Physical Exam  ?Constitutional: Appears well-developed and well-nourished.  ?Psych: Affect appropriate to situation, calm and cooperative, eager to go home ?Eyes: No scleral injection ?HENT: No oropharyngeal obstruction.  ?MSK: no joint deformities.  ?Cardiovascular: Perfusing extremities well ?Respiratory: Effort normal, non-labored breathing ?GI: Soft.  No distension. There is no tenderness.  ?Skin: Warm dry and intact visible skin ? ?Neuro: ?Mental Status: ?Patient is awake, alert, oriented to person, place, month, year, and situation. ?Patient is able to give a clear and coherent history. ?No signs of aphasia or neglect ?Cranial Nerves: ?II: Visual Fields are full.  His left pupil is slightly larger than the right but both are reactive to light, the left slightly more sluggishly ?III,IV, VI: Monocular ductions are intact, but on versions he has difficulty moving the left eye medially when the right eye moves laterally, though he does not have clear nystagmus with this maneuver he does complain of double vision on rightward gaze.  Additionally he has left eye ptosis ?V: Facial sensation is symmetric to temperature ?VII: Facial movement is symmetric.  ?VIII: hearing is intact  to voice ?X: Uvula elevates symmetrically ?XI: Shoulder shrug is symmetric. ?XII: tongue is midline without atrophy or fasciculations.  ?Motor: ?Tone is normal. Bulk is normal. 5/5 strength was present in all four extremities.  ?Sensory: ?Sensation is symmetric to light touch and temperature in the arms and legs. ?Deep Tendon Reflexes: ?2+ and symmetric in the brachioradialis and patellae  ?Plantars: ?Toes are downgoing bilaterally.  ?Cerebellar: ?He has bilateral discoordination on finger-nose in the upper extremities, but  heel-to-shin is fairly intact ?Gait:  ?He is able to rise on his heels and his toes, he ambulates rapidly on casual gait and notes he is much less steady than usual.  He is completely unable to tandem ? ?NIHSS total 3: One-point for partial gaze palsy, 2 points for bilateral upper extremity ataxia ?Performed at ~3:30 PM ? ? ?I have reviewed labs in epic and the results pertinent to this consultation are: ? ?Basic Metabolic Panel: ?Recent Labs  ?Lab 09/03/21 ?1118  ?NA 140  ?K 4.2  ?CL 109  ?CO2 25  ?GLUCOSE 110*  ?BUN 24*  ?CREATININE 1.39*  ?CALCIUM 9.1  ? ? ?CBC: ?Recent Labs  ?Lab 09/03/21 ?1118  ?WBC 7.9  ?NEUTROABS 3.8  ?HGB 16.0  ?HCT 49.6  ?MCV 92.9  ?PLT 235  ? ? ?Coagulation Studies: ?Recent Labs  ?  09/03/21 ?1118  ?LABPROT 14.6  ?INR 1.1  ?  ? ? ?I have reviewed the images obtained: ?Head CT negative for acute intracranial process ?MRI brain negative for acute intracranial process, however notable for asymmetric ventricular enlargement with enlarged left hemisphere ventricles ? ?Impression: This is an 88 year old man with limited known stroke risk factors presenting with acute onset diplopia.  Description of the event is concerning for vascular etiology, and notably a small brainstem stroke at the level of the medial longitudinal fasciculus could explain much of his symptoms and could be missed on imaging.  Therefore I favor an MRI negative brainstem stroke.  Alternatively on the differential would be a Financial controller variant GBS which I feel is less likely given he does have intact reflexes; however given this possibility will hold off on Plavix and monitor at least overnight ? ?Recommendations: ?# Likely MRI negative brainstem stroke ?- Stroke labs TSH, HgbA1c, fasting lipid panel ?- Frequent neuro checks ?- Echocardiogram ?- Prophylactic therapy-Antiplatelet med: Aspirin - dose 325mg  PO or 300mg  PR, followed by 81 mg daily ?- Hold on Plavix for now, ?- Risk factor modification ?- Telemetry monitoring;  30 day event monitor on discharge if no arrythmias captured  ?- Blood pressure goal  ? - Permissive hypertension to 220/120  ?- PT consult, OT consult, Speech consult, unless patient is back to baseline ?- Neurology to follow ? ?Lesleigh Noe MD-PhD ?[facility] ?[phone] ?[facility] coverage for [facility] is from 8 AM to 4 AM in-house and 4 PM to 8 PM by telephone/video. 8 PM to 8 AM emergent questions or overnight urgent questions should be addressed to Teleneurology On-call or [facility] neurohospitalist; contact information can be found on AMION ? ? ?

## 2021-09-03 NOTE — H&P (Signed)
?History and Physical  ? ? ?Patient: George Waller DOB: 1933-04-26 ?DOA: 09/03/2021 ?DOS: the patient was seen and examined on 09/04/2021 ?PCP: Pcp, No  ?Patient coming from: Home ? ?Chief Complaint:  ?Chief Complaint  ?Patient presents with  ? Visual Field Change  ? Dizziness  ? ?HPI: George Waller is a 88 y.o. male with medical history significant of arthritis presenting with diplopia and balance issues.  Symptoms started about noon time when he was with his brother listing to things when his weakness of right but denied any visual field description as the change weakness numbness incontinence of chills abdominal pain did not report any other symptoms.  Patient typically just takes [facility] powder for his arthritis and is otherwise in good health. ? ? ?Review of Systems: Review of Systems  ?Eyes:  Positive for double vision.  ?All other systems reviewed and are negative. ? ?Past Medical History:  ?Diagnosis Date  ? Kidney stones   ? ?History reviewed. No pertinent surgical history. ?Social History:  reports that he has never smoked. He has never used smokeless tobacco. He reports that he does not currently use alcohol. He reports that he does not use drugs. ? ?Allergies  ?Allergen Reactions  ? Codeine Swelling  ? Sulfa Antibiotics Itching  ? ? ?Family History  ?Problem Relation Age of Onset  ? Cancer Mother   ? Thyroid disease Mother   ? Diabetes Mother   ? Cancer Father   ? ? ?Prior to Admission medications   ?Medication Sig Start Date End Date Taking? Authorizing Provider  ?acetaminophen (TYLENOL) 500 MG tablet Take 500 mg by mouth every 6 (six) hours as needed.   Yes [provider]  ?mirabegron ER (MYRBETRIQ) 50 MG TB24 tablet Take 1 tablet (50 mg total) by mouth daily. 07/16/21  Yes Stoioff, Verna Czech, MD  ?albuterol (PROVENTIL HFA;VENTOLIN HFA) 108 (90 Base) MCG/ACT inhaler Inhale 1-2 puffs into the lungs every 6 (six) hours as needed for wheezing or shortness of breath. ?Patient  not taking: Reported on 09/03/2021 01/26/17   Payton Mccallum, MD  ?silodosin (RAPAFLO) 8 MG CAPS capsule TAKE 1 CAPSULE BY MOUTH DAILY WITH BREAKFAST. ?Patient not taking: Reported on 09/03/2021 06/05/21   Riki Altes, MD  ? ? ?Physical Exam: ?Vitals:  ? 09/03/21 1356 09/03/21 1515 09/03/21 1840 09/03/21 2241  ?BP: (!) 143/71 (!) 158/76 (!) 170/88 (!) 159/80  ?Pulse: (!) 47 (!) 49 (!) 50 (!) 46  ?Resp: 14 16 16 18   ?Temp:    (!) 96.8 ?F (36 ?C)  ?TempSrc:    Oral  ?SpO2: 100% 95% 97% 96%  ?Weight:      ?Height:      ?Physical Exam ?Vitals and nursing note reviewed.  ?Constitutional:   ?   General: He is not in acute distress. ?   Appearance: Normal appearance. He is not ill-appearing, toxic-appearing or diaphoretic.  ?HENT:  ?   Head: Normocephalic and atraumatic.  ?   Right Ear: Hearing and external ear normal.  ?   Left Ear: Hearing and external ear normal.  ?   Nose: Nose normal. No nasal deformity.  ?   Mouth/Throat:  ?   Lips: Pink.  ?   Mouth: Mucous membranes are moist.  ?   Tongue: No lesions.  ?   Pharynx: Oropharynx is clear.  ?Eyes:  ?   General: Lids are normal. Vision grossly intact.  ?   Extraocular Movements: Extraocular movements intact.  ?  Right eye: Normal extraocular motion and no nystagmus.  ?   Left eye: Normal extraocular motion and no nystagmus.  ?   Pupils: Pupils are equal, round, and reactive to light.  ?Neck:  ?   Vascular: No carotid bruit.  ?Cardiovascular:  ?   Rate and Rhythm: Normal rate and regular rhythm.  ?   Pulses: Normal pulses.  ?   Heart sounds: Normal heart sounds.  ?Pulmonary:  ?   Effort: Pulmonary effort is normal.  ?   Breath sounds: Normal breath sounds.  ?Abdominal:  ?   General: Bowel sounds are normal. There is no distension.  ?   Palpations: Abdomen is soft. There is no mass.  ?   Tenderness: There is no abdominal tenderness. There is no guarding.  ?   Hernia: No hernia is present.  ?Musculoskeletal:  ?   Right lower leg: No edema.  ?   Left lower leg: No edema.   ?Skin: ?   General: Skin is warm.  ?Neurological:  ?   General: No focal deficit present.  ?   Mental Status: He is alert and oriented to person, place, and time.  ?   Cranial Nerves: Cranial nerves 2-12 are intact. No cranial nerve deficit.  ?   Motor: Motor function is intact. No weakness.  ?   Coordination: Coordination normal.  ?   Deep Tendon Reflexes: Reflexes normal.  ?Psychiatric:     ?   Attention and Perception: Attention normal.     ?   Mood and Affect: Mood normal.     ?   Speech: Speech normal.     ?   Behavior: Behavior normal. Behavior is cooperative.     ?   Cognition and Memory: Cognition normal.  ? ? ?Data Reviewed: ?Results for orders placed or performed during the hospital encounter of 09/03/21 (from the past 24 hour(s))  ?Protime-INR     Status: None  ? Collection Time: 09/03/21 11:18 AM  ?Result Value Ref Range  ? Prothrombin Time 14.6 11.4 - 15.2 seconds  ? INR 1.1 0.8 - 1.2  ?APTT     Status: None  ? Collection Time: 09/03/21 11:18 AM  ?Result Value Ref Range  ? aPTT 35 24 - 36 seconds  ?CBC     Status: None  ? Collection Time: 09/03/21 11:18 AM  ?Result Value Ref Range  ? WBC 7.9 4.0 - 10.5 K/uL  ? RBC 5.34 4.22 - 5.81 MIL/uL  ? Hemoglobin 16.0 13.0 - 17.0 g/dL  ? HCT 49.6 39.0 - 52.0 %  ? MCV 92.9 80.0 - 100.0 fL  ? MCH 30.0 26.0 - 34.0 pg  ? MCHC 32.3 30.0 - 36.0 g/dL  ? RDW 12.4 11.5 - 15.5 %  ? Platelets 235 150 - 400 K/uL  ? nRBC 0.0 0.0 - 0.2 %  ?Differential     Status: Abnormal  ? Collection Time: 09/03/21 11:18 AM  ?Result Value Ref Range  ? Neutrophils Relative % 49 %  ? Neutro Abs 3.8 1.7 - 7.7 K/uL  ? Lymphocytes Relative 29 %  ? Lymphs Abs 2.3 0.7 - 4.0 K/uL  ? Monocytes Relative 7 %  ? Monocytes Absolute 0.6 0.1 - 1.0 K/uL  ? Eosinophils Relative 14 %  ? Eosinophils Absolute 1.1 (H) 0.0 - 0.5 K/uL  ? Basophils Relative 1 %  ? Basophils Absolute 0.1 0.0 - 0.1 K/uL  ? Immature Granulocytes 0 %  ? Abs Immature Granulocytes 0.02 0.00 -  0.07 K/uL  ?Comprehensive metabolic panel      Status: Abnormal  ? Collection Time: 09/03/21 11:18 AM  ?Result Value Ref Range  ? Sodium 140 135 - 145 mmol/L  ? Potassium 4.2 3.5 - 5.1 mmol/L  ? Chloride 109 98 - 111 mmol/L  ? CO2 25 22 - 32 mmol/L  ? Glucose, Bld 110 (H) 70 - 99 mg/dL  ? BUN 24 (H) 8 - 23 mg/dL  ? Creatinine, Ser 1.39 (H) 0.61 - 1.24 mg/dL  ? Calcium 9.1 8.9 - 10.3 mg/dL  ? Total Protein 7.7 6.5 - 8.1 g/dL  ? Albumin 3.9 3.5 - 5.0 g/dL  ? AST 19 15 - 41 U/L  ? ALT 17 0 - 44 U/L  ? Alkaline Phosphatase 93 38 - 126 U/L  ? Total Bilirubin 0.7 0.3 - 1.2 mg/dL  ? GFR, Estimated 49 (L) >60 mL/min  ? Anion gap 6 5 - 15  ?CBC     Status: None  ? Collection Time: 09/03/21  7:38 PM  ?Result Value Ref Range  ? WBC 9.2 4.0 - 10.5 K/uL  ? RBC 5.35 4.22 - 5.81 MIL/uL  ? Hemoglobin 16.2 13.0 - 17.0 g/dL  ? HCT 49.6 39.0 - 52.0 %  ? MCV 92.7 80.0 - 100.0 fL  ? MCH 30.3 26.0 - 34.0 pg  ? MCHC 32.7 30.0 - 36.0 g/dL  ? RDW 12.6 11.5 - 15.5 %  ? Platelets 235 150 - 400 K/uL  ? nRBC 0.0 0.0 - 0.2 %  ?Creatinine, serum     Status: Abnormal  ? Collection Time: 09/03/21  7:38 PM  ?Result Value Ref Range  ? Creatinine, Ser 1.46 (H) 0.61 - 1.24 mg/dL  ? GFR, Estimated 47 (L) >60 mL/min  ?T4, free     Status: None  ? Collection Time: 09/03/21  7:38 PM  ?Result Value Ref Range  ? Free T4 1.04 0.61 - 1.12 ng/dL  ?TSH     Status: None  ? Collection Time: 09/03/21  7:38 PM  ?Result Value Ref Range  ? TSH 2.693 0.350 - 4.500 uIU/mL  ? ? ? ?Assessment and Plan: ?* Balance problem ?Concern for balance problem and brought by family.  ?We will admit for stroke workup. ?Code stroke was called and per neuro recommendation: ?Recommendations: ?# Likely MRI negative brainstem stroke ?- Stroke labs TSH, HgbA1c, fasting lipid panel ?- Frequent neuro checks ?- Echocardiogram ?- Prophylactic therapy-Antiplatelet med: Aspirin - dose 325mg  PO or 300mg  PR, followed by 81 mg daily ?- Hold on Plavix for now, ?- Risk factor modification ?- Telemetry monitoring; 30 day event monitor on discharge  if no arrythmias captured  ?- Blood pressure goal  ? - Permissive hypertension to 220/120  ?- PT consult, OT consult, Speech consult, unless patient is back to baseline ?- Neurology to follow ? ?Diplopi

## 2021-09-04 ENCOUNTER — Observation Stay (HOSPITAL_BASED_OUTPATIENT_CLINIC_OR_DEPARTMENT_OTHER)
Admit: 2021-09-04 | Discharge: 2021-09-04 | Disposition: A | Discharge status: Home / Self Care | Payer: Medicare Other | Attending: Internal Medicine | Admitting: Internal Medicine

## 2021-09-04 DIAGNOSIS — R2689 Other abnormalities of gait and mobility: Secondary | ICD-10-CM | POA: Diagnosis not present

## 2021-09-04 DIAGNOSIS — N179 Acute kidney failure, unspecified: Secondary | ICD-10-CM | POA: Diagnosis present

## 2021-09-04 DIAGNOSIS — I6389 Other cerebral infarction: Secondary | ICD-10-CM

## 2021-09-04 LAB — LIPID PANEL
Cholesterol: 136 mg/dL (ref 0–200)
HDL: 29 mg/dL — ABNORMAL LOW (ref >40)
LDL Cholesterol: 92 mg/dL (ref 0–99)
Total CHOL/HDL Ratio: 4.7 RATIO
Triglycerides: 73 mg/dL (ref <150)
VLDL: 15 mg/dL (ref 0–40)

## 2021-09-04 LAB — ECHOCARDIOGRAM COMPLETE BUBBLE STUDY
AR max vel: 2.42 cm2
AV Area VTI: 2.57 cm2
AV Area mean vel: 2.31 cm2
AV Mean grad: 2.5 mmHg
AV Peak grad: 4.4 mmHg
Ao pk vel: 1.05 m/s
Area-P 1/2: 3.87 cm2
MV VTI: 1.65 cm2
S' Lateral: 2.3 cm

## 2021-09-04 MED ORDER — ATORVASTATIN CALCIUM 10 MG PO TABS: 10.0000 mg | ORAL_TABLET | Freq: Every day | ORAL | 11 refills | Status: DC

## 2021-09-04 MED ORDER — CLOPIDOGREL BISULFATE 75 MG PO TABS
75.0000 mg | ORAL_TABLET | Freq: Every day | ORAL | 0 refills | Status: DC
Start: 2021-09-05 — End: 2021-11-20

## 2021-09-04 MED ORDER — CLOPIDOGREL BISULFATE 75 MG PO TABS
75.0000 mg | ORAL_TABLET | Freq: Every day | ORAL | Status: DC
Start: 2021-09-05 — End: 2021-09-04

## 2021-09-04 MED ORDER — ATORVASTATIN CALCIUM 20 MG PO TABS: 10.0000 mg | ORAL_TABLET | Freq: Every day | ORAL | Status: DC

## 2021-09-04 MED ORDER — ASPIRIN 81 MG PO TBEC: 81.0000 mg | DELAYED_RELEASE_TABLET | Freq: Every day | ORAL | 11 refills | Status: AC

## 2021-09-04 MED ORDER — ASPIRIN EC 81 MG PO TBEC: 81.0000 mg | DELAYED_RELEASE_TABLET | Freq: Every day | ORAL | Status: DC

## 2021-09-04 MED ORDER — CLOPIDOGREL BISULFATE 75 MG PO TABS
300.0000 mg | ORAL_TABLET | Freq: Once | ORAL | Status: AC
  Administered 2021-09-04: 300 mg via ORAL
  Filled 2021-09-04: qty 4

## 2021-09-04 NOTE — Assessment & Plan Note (Signed)
Concern for balance problem and brought by family.  ?We will admit for stroke workup. ?Code stroke was called and per neuro recommendation: ?Recommendations: ?# Likely MRI negative brainstem stroke ?- Stroke labs TSH, HgbA1c, fasting lipid panel ?- Frequent neuro checks ?- Echocardiogram ?- Prophylactic therapy-Antiplatelet med: Aspirin - dose 325mg  PO or 300mg  PR, followed by 81 mg daily ?- Hold on Plavix for now, ?- Risk factor modification ?- Telemetry monitoring; 30 day event monitor on discharge if no arrythmias captured  ?- Blood pressure goal  ? - Permissive hypertension to 220/120  ?- PT consult, OT consult, Speech consult, unless patient is back to baseline ?- Neurology to follow ?

## 2021-09-04 NOTE — Evaluation (Signed)
Physical Therapy Evaluation ?Patient Details ?Name: George Waller ?MRN: [id number] ?DOB: Jan 14, 1933 ?Today's Date: 09/04/2021 ? ?History of Present Illness ? 88 y.o. male with medical history significant of arthritis, kidney stones, presenting with diplopia and balance issues; admitted for TIA/CVA work up. 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  ?Clinical Impression ? Patient seated in recliner upon arrival to room; alert and oriented, follows commands, eager for mobility and potential discharge home.  Denies acute pain, but does endorse mild soreness to R calf/ankle from previous strain.  Reports diplopia fully resolved; do note very mild lateral deviation (and decreased convergence) of L eye with visual tracking/scanning.  Bilat UE/LE strength and ROM grossly symmetrical and WFL; no focal weakness appreciated.  Denies numbness/paresthesia throughout.  Able to complete sit/stand, basic transfers and gait (200') without assist device, cga/close sup.  Demonstrates reciprocal stepping with slightly inconsistent step height/length and foot placement; decreased weight shift to L ant/lateral direction throughout gait cycle. Mild sway, deviation with dynamic gait components (esp L head turns); excessive use of step strategy for sudden cessation of gait progression.  Decreased attention to L visual field at times, requiring cuing from therapist to integrate L UE swing, avoid bumping obstacles/door frame to L of midline. ?Higher level balance deficits evident (L > R LE), evidenced by BERG score of 39/56.  Greatest difficulty with activities requiring narrowed, tandem BOS or periods of SLS; noted change in balance with dual-tasking activities.  Patient declines trial of assist device at this time; does voice understanding of safety strategies to optimize safety/indep with gait. ?Would benefit from skilled PT to address above deficits and promote optimal return to PLOF.;  recommend transition to home with outpatient PT follow up as medically appropriate. ?   ?   ? ?Recommendations for follow up therapy are one component of a multi-disciplinary discharge planning process, led by the attending physician.  Recommendations may be updated based on patient status, additional functional criteria and insurance authorization. ? ?Follow Up Recommendations Outpatient PT ? ?  ?Assistance Recommended at Discharge PRN  ?Patient can return home with the following ? A little help with walking and/or transfers;A little help with bathing/dressing/bathroom ? ?  ?Equipment Recommendations    ?Recommendations for Other Services ?    ?  ?Functional Status Assessment Patient has had a recent decline in their functional status and demonstrates the ability to make significant improvements in function in a reasonable and predictable amount of time.  ? ?  ?Precautions / Restrictions Precautions ?Precautions: Fall ?Restrictions ?Weight Bearing Restrictions: No  ? ?  ? ?Mobility ? Bed Mobility ?  ?  ?  ?  ?  ?  ?  ?General bed mobility comments: Up in recliner beginning/end of treatment session ?  ? ?Transfers ?Overall transfer level: Needs assistance ?Equipment used: None ?Transfers: Sit to/from Stand ?Sit to Stand: Supervision, Modified independent (Device/Increase time) ?  ?  ?  ?  ?  ?  ?  ? ?Ambulation/Gait ?Ambulation/Gait assistance: Supervision, Min guard ?Gait Distance (Feet): 200 Feet ?Assistive device: None ?  ?  ?  ?  ?General Gait Details: reciprocal stepping with slightly inconsistent step height/length and foot placement; decreased weight shift to L ant/lateral direction throughout gait cycle. Mild sway, deviation with dynamic gait components (esp L head turns); excessive use of step strategy for sudden cessation of gait progression.  Decreased attention to L visual field at times, requiring cuing from therapist to integrate L UE swing, avoid  bumping obstacles/door frame to L of  midline. ? ?Stairs ?  ?  ?  ?  ?  ? ?Wheelchair Mobility ?  ? ?Modified Rankin (Stroke Patients Only) ?  ? ?  ? ?Balance Overall balance assessment: Needs assistance ?Sitting-balance support: Feet supported, No upper extremity supported ?Sitting balance-Leahy Scale: Good ?  ?  ?Standing balance support: No upper extremity supported ?Standing balance-Leahy Scale: Fair ?  ?  ?  ?  ?  ?  ?  ?  ?  ?Standardized Balance Assessment ?Standardized Balance Assessment : Berg Balance Test ?Sharlene MottsBerg Balance Test ?Sit to Stand: Able to stand without using hands and stabilize independently ?Standing Unsupported: Able to stand safely 2 minutes ?Sitting with Back Unsupported but Feet Supported on Floor or Stool: Able to sit safely and securely 2 minutes ?Stand to Sit: Sits safely with minimal use of hands ?Transfers: Able to transfer safely, minor use of hands ?Standing Unsupported with Eyes Closed: Able to stand 10 seconds safely ?Standing Ubsupported with Feet Together: Able to place feet together independently and stand for 1 minute with supervision ?From Standing, Reach Forward with Outstretched Arm: Can reach forward >5 cm safely (2") ?From Standing Position, Pick up Object from Floor: Unable to pick up and needs supervision ?From Standing Position, Turn to Look Behind Over each Shoulder: Looks behind one side only/other side shows less weight shift ?Turn 360 Degrees: Able to turn 360 degrees safely one side only in 4 seconds or less ?Standing Unsupported, Alternately Place Feet on Step/Stool: Able to complete >2 steps/needs minimal assist ?Standing Unsupported, One Foot in Front: Needs help to step but can hold 15 seconds ?Standing on One Leg: Tries to lift leg/unable to hold 3 seconds but remains standing independently ?Total Score: 39 ?  ?   ? ? ? ?Pertinent Vitals/Pain Pain Assessment ?Pain Assessment: No/denies pain  ? ? ?Home Living Family/patient expects to be discharged to:: Private residence ?Living Arrangements:  Spouse/significant other ?Available Help at Discharge: Family;Available 24 hours/day;Available PRN/intermittently ?Type of Home: House ?Home Access: Stairs to enter ?Entrance Stairs-Rails: Right ?Entrance Stairs-Number of Steps: 2 ?  ?Home Layout: One level ?Home Equipment: Agricultural consultantolling Walker (2 wheels);Other (comment) ?Additional Comments: 3 point cane  ?  ?Prior Function Prior Level of Function : Independent/Modified Independent ?  ?  ?  ?  ?  ?  ?Mobility Comments: Ambulatory without assist device for ADLs, household and community mobilization; does endorse 2-3 falls in past 86mo, tends to trip/stumble ?ADLs Comments: indep, works full time as a Curatormechanic ?  ? ? ?Hand Dominance  ? Dominant Hand: Right ? ?  ?Extremity/Trunk Assessment  ? Upper Extremity Assessment ?Upper Extremity Assessment: Overall WFL for tasks assessed ?  ? ?Lower Extremity Assessment ?Lower Extremity Assessment: Overall WFL for tasks assessed (grossly 4+ to 5/5 throughout with isolated testing; denies numbness/paresthesia; does endorse recent 'strain' to R calf) ?  ? ?Cervical / Trunk Assessment ?Cervical / Trunk Assessment: Normal  ?Communication  ? Communication: No difficulties  ?Cognition Arousal/Alertness: Awake/alert ?Behavior During [facility] for tasks assessed/performed ?Overall Cognitive Status: Within Functional Limits for tasks assessed ?  ?  ?  ?  ?  ?  ?  ?  ?  ?  ?  ?  ?  ?  ?  ?  ?  ?  ?  ? ?  ?General Comments   ? ?  ?Exercises Other Exercises ?Other Exercises: Reviewed role of PT and progressive mobility, reviewed safety needs/recommendations with transfers/gait (visual scanning  to L, minimizing dual-tasking activities); patient voiced understanding.  ? ?Assessment/Plan  ?  ?PT Assessment Patient needs continued PT services  ?PT Problem List Decreased balance;Decreased mobility;Decreased coordination;Decreased knowledge of use of DME;Decreased knowledge of precautions;Decreased safety awareness ? ?   ?  ?PT Treatment  Interventions DME instruction;Gait training;Stair training;Functional mobility training;Therapeutic activities;Therapeutic exercise;Balance training;Neuromuscular re-education;Cognitive remediation;Patient/family education   ? ?PT Goals (C

## 2021-09-04 NOTE — Evaluation (Signed)
Occupational Therapy Evaluation ?Patient Details ?Name: George Waller ?MRN: [id number] ?DOB: 12/29/1932 ?Today's Date: 09/04/2021 ? ? ?History of Present Illness 88 y.o. male with medical history significant of arthritis, kidney stones, presenting with diplopia and balance issues.  ? ?Clinical Impression ?  ?Pt seen for OT evaluation this date. Prior to hospital admission, pt was independent in all aspects of ADL/IADL, working full time as a Pension scheme manager, and living with his partner in a 1 story home with 2 steps and R rail. Pt endorses 2-3 falls in past 44mo where he trips/stumbles. Currently pt reporting symptoms have resolved and is eager to return home promptly. Son present and supportive throughout. Pt demonstrates very close to or at baseline independence to perform ADL and mobility tasks. Hx L shoulder injury resulting in mild decreased L shoulder strength, but otherwise BUE grossly WFL for strength, coordination, and sensation as well as BLE. No visual deficits appreciated upon assessment. Pt alert and oriented x4, no cognitive deficits appreciated. Mild balance deficits noted with mobility and both pt/son endorse pt appears to be near baseline for balance as well. Pt/son educated briefly in falls prevention strategies. Both verbalized understanding. No skilled OT needs identified. Will sign off. Please re-consult if additional OT needs arise.   ? ?Recommendations for follow up therapy are one component of a multi-disciplinary discharge planning process, led by the attending physician.  Recommendations may be updated based on patient status, additional functional criteria and insurance authorization.  ? ?Follow Up Recommendations ? No OT follow up  ?  ?Assistance Recommended at Discharge None  ?Patient can return home with the following   ? ?  ?Functional Status Assessment ? Patient has not had a recent decline in their functional status  ?Equipment Recommendations ? None recommended by OT  ?   ?Recommendations for Other Services   ? ? ?  ?Precautions / Restrictions Precautions ?Precautions: Fall ?Restrictions ?Weight Bearing Restrictions: No  ? ?  ? ?Mobility Bed Mobility ?  ?  ?  ?  ?  ?  ?  ?General bed mobility comments: NT, up in recliner ?  ? ?Transfers ?Overall transfer level: Modified independent ?Equipment used: None ?  ?  ?  ?  ?  ?  ?  ?General transfer comment: supervision, no LOB noted ?  ? ?  ?Balance Overall balance assessment: Needs assistance ?Sitting-balance support: No upper extremity supported, Feet supported ?Sitting balance-Leahy Scale: Normal ?  ?  ?Standing balance support: No upper extremity supported ?Standing balance-Leahy Scale: Fair ?Standing balance comment: mildly unsteady with mobility but no overt LOB. Pt/son endorse pt looks at his baseline for mobility/balance ?  ?  ?  ?  ?  ?  ?  ?  ?  ?  ?  ?   ? ?ADL either performed or assessed with clinical judgement  ? ?ADL Overall ADL's : Modified independent ?  ?  ?  ?  ?  ?  ?  ?  ?  ?  ?  ?  ?  ?  ?  ?  ?  ?  ?  ?General ADL Comments: Pt required slightly increased time to tie his shoes than normal but still independent and no overt deficits appreciated.  ? ? ? ?Vision Baseline Vision/History: 1 Wears glasses ?Ability to See in Adequate Light: 0 Adequate ?Patient Visual Report: No change from baseline ?Vision Assessment?: No apparent visual deficits ?Additional Comments: No deficits with testing appreciated. Pt denied diplopia, no deficits with visual tracking,  scanning, convergence, etc appreciated with testing  ?   ?Perception Perception ?Perception: Within Functional Limits ?  ?Praxis Praxis ?Praxis: Intact ?  ? ?Pertinent Vitals/Pain Pain Assessment ?Pain Assessment: No/denies pain  ? ? ? ?Hand Dominance Right ?  ?Extremity/Trunk Assessment Upper Extremity Assessment ?Upper Extremity Assessment: Overall WFL for tasks assessed (hx L shoulder injury resulting in 4+/5 strength, otherwise 5/5 bilat, initially mild hand eye  coordination deficits noted but on 2nd attempt normal on both sides, sensation intact) ?  ?Lower Extremity Assessment ?Lower Extremity Assessment: Overall WFL for tasks assessed (hx L knee cap injury from fall >33yr ago, but strength, coordination, and sensation all WNL) ?  ?Cervical / Trunk Assessment ?Cervical / Trunk Assessment: Normal ?  ?Communication Communication ?Communication: No difficulties ?  ?Cognition Arousal/Alertness: Awake/alert ?Behavior During Therapy: [facility] for tasks assessed/performed ?Overall Cognitive Status: Within Functional Limits for tasks assessed ?  ?  ?  ?  ?  ?  ?  ?  ?  ?  ?  ?  ?  ?  ?  ?  ?  ?  ?  ?General Comments    ? ?  ?Exercises Other Exercises ?Other Exercises: pt/son educated briefly in falls prevention ?  ?Shoulder Instructions    ? ? ?Home Living Family/patient expects to be discharged to:: Private residence ?Living Arrangements: Spouse/significant other ("lady friend" of 25-30 years) ?Available Help at Discharge: Family;Available 24 hours/day;Available PRN/intermittently (could be 24/7 if needed) ?Type of Home: House ?Home Access: Stairs to enter ?Entrance Stairs-Number of Steps: 2 ?Entrance Stairs-Rails: Right ?Home Layout: One level ?  ?  ?Bathroom Shower/Tub: Walk-in shower ?  ?Bathroom Toilet: Standard ?  ?  ?Home Equipment: Conservation officer, nature (2 wheels);Other (comment) ?  ?Additional Comments: 3 point cane ?  ? ?  ?Prior Functioning/Environment Prior Level of Function : Independent/Modified Independent ?  ?  ?  ?  ?  ?  ?Mobility Comments: no AD, 2-3 falls in past 8mo, tends to trip/stumble ?ADLs Comments: indep, works full time as a Dealer ?  ? ?  ?  ?OT Problem List: Impaired balance (sitting and/or standing) ?  ?   ?OT Treatment/Interventions:    ?  ?OT Goals(Current goals can be found in the care plan section) Acute Rehab OT Goals ?Patient Stated Goal: go home ?OT Goal Formulation: All assessment and education complete, DC therapy  ?OT Frequency:   ?  ? ?Co-evaluation    ?  ?  ?  ?  ? ?  ?AM-PAC OT "6 Clicks" Daily Activity     ?Outcome Measure Help from another person eating meals?: None ?Help from another person taking care of personal grooming?: None ?Help from another person toileting, which includes using toliet, bedpan, or urinal?: None ?Help from another person bathing (including washing, rinsing, drying)?: None ?Help from another person to put on and taking off regular upper body clothing?: None ?Help from another person to put on and taking off regular lower body clothing?: None ?6 Click Score: 24 ?  ?End of Session Equipment Utilized During Treatment: Gait belt ? ?Activity Tolerance: Patient tolerated treatment well ?Patient left: in chair;with call bell/phone within reach;with family/visitor present ? ?OT Visit Diagnosis: History of falling (Z91.81);Unsteadiness on feet (R26.81)  ?              ?Time: [id number] ?OT Time Calculation (min): 21 min ?Charges:  OT General Charges ?$OT Visit: 1 Visit ?OT Evaluation ?$OT Eval Low Complexity: 1 Low ? ?Ardeth Perfect., MPH, MS, OTR/L ?  ascom [phone] ?09/04/21, 9:49 AM ?

## 2021-09-04 NOTE — ED Notes (Signed)
Pt in bed with spouse at bedside, NAD, a/ox4, states feeling much better today but states still has some blurry vision/diplopia.  ?

## 2021-09-04 NOTE — Hospital Course (Signed)
George Waller is an 88 y.o. M with limited prior medical follow up who presented with 1 day acute onset diplopia and ataxia.  ? ?In the ER, MRI brain normal.  Neurology were consulted. ?

## 2021-09-04 NOTE — Progress Notes (Signed)
SLP Cancellation Note ? ?Patient Details ?Name: George Waller ?MRN: [id number] ?DOB: 1932/06/13 ? ? ?Cancelled treatment:       Reason Eval/Treat Not Completed: SLP screened, no needs identified, will sign off (chart reviewed; consulted MD re: pt's status) ?Pt denied any difficulty swallowing and is currently on a regular diet; tolerates swallowing pills w/ water per NSG. Pt converses in conversation w/out expressive/receptive deficits noted by [place] staff; speech clear. ?No further skilled ST services indicated as pt appears at his baseline. Noted Neurology note; MD stated pt has no needs. NSG to reconsult if any change in status while admitted.   ? ? ? ? ? ?Orinda Kenner, MS, CCC-SLP ?Speech Language Pathologist ?Rehab Services; [place] ?[phone] (ascom) ?Tin Engram ?09/04/2021, 3:25 PM ?

## 2021-09-04 NOTE — Assessment & Plan Note (Signed)
We will check thyroid/ a1c and b12. ?Ophthalmology eval as outpatient as well.  ? ?

## 2021-09-04 NOTE — Discharge Summary (Signed)
?Physician Discharge Summary ?  ?Patient: George Waller MRN: [id number] DOB: 12-12-1932  ?Admit date:     09/03/2021  ?Discharge date: 09/04/21  ?Discharge Physician: Alberteen Sam  ? ?PCP: Margaretann Loveless, MD  ? ?Recommendations at discharge:  ?Follow up with new PCP as soon as established for BP measurement and repeat LDL measurement ?Follow up with Neurology in 2-3 months ? ?Discharge Diagnoses: ?Principal Problem: ?  Acute stroke  ?Active Problems: ?  CKD IIIa ?  ? ? ? ? ?Hospital Course: ?Mr. Hamman is an 88 y.o. M with limited prior medical follow up who presented with 1 day acute onset diplopia and ataxia.  ? ?In the ER, MRI brain normal.  Neurology were consulted. ? ? 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  Carotid imaging and intracranial angiography normal.  Echo normal.   ?LDL mildly elevated, given atorvastatin at discharge.  Aspirin and Plavix for 3 weeks then aspirin 81 mg alone indefeinitly.   ? ?No atrial fibrillation detected.  PT eval recommended outpatient PT.  Nonsmoker.  Swallow eval passed in ER. ? ? ? ?Chronic kidney disease IIIa ?Acute kidney injury ruled out.   ?- Follow up with new PCP ? ? ? ? ? ?  ? ?Pain control - [employer] Controlled Substance Reporting System database was reviewed.  ? ? ? ? ?Consultants: Neurology ?Procedures performed: MRI brain, MRA head and neck, Echocardiogram  ?Disposition: Home ?Diet recommendation:  ?Discharge Diet Orders (From admission, onward)  ? ?  Start     Ordered  ? 09/04/21 0000  Diet - low sodium heart healthy       ? 09/04/21 1509  ? ?  ?  ? ?  ? ? ?DISCHARGE MEDICATION: ?Allergies as of 09/04/2021   ? ?   Reactions  ? Codeine Swelling  ? Sulfa Antibiotics Itching  ? ?  ? ?  ?Medication List  ?  ? ?STOP taking these medications   ? ?albuterol 108 (90 Base) MCG/ACT inhaler ?Commonly known as: VENTOLIN HFA ?  ?silodosin 8 MG Caps capsule ?Commonly known as: RAPAFLO ?  ? ?  ? ?TAKE these medications   ? ?acetaminophen  500 MG tablet ?Commonly known as: TYLENOL ?Take 500 mg by mouth every 6 (six) hours as needed. ?  ?aspirin 81 MG EC tablet ?Take 1 tablet (81 mg total) by mouth daily. Swallow whole. ?Start taking on: September 05, 2021 ?  ?atorvastatin 10 MG tablet ?Commonly known as: LIPITOR ?Take 1 tablet (10 mg total) by mouth at bedtime. ?  ?clopidogrel 75 MG tablet ?Commonly known as: PLAVIX ?Take 1 tablet (75 mg total) by mouth daily. ?Start taking on: September 05, 2021 ?  ?mirabegron ER 50 MG Tb24 tablet ?Commonly known as: MYRBETRIQ ?Take 1 tablet (50 mg total) by mouth daily. ?  ? ?  ? ? Follow-up Information   ? ? Lonell Face, MD. Schedule an appointment as soon as possible for a visit in 2 month(s).   ?Specialty: Neurology ?Why: For stroke follow up ?Contact information: ?[address] ?[facility] ?[place] [address] ?[phone] ? ? ?  ?  ? ? Margaretann Loveless, MD Follow up on 09/11/2021.   ?Specialty: Internal Medicine ?Why: Go to the 2nd floor to check in- Appointment time 1015 am ?Please arrive by 9:45 am. ?Bring with you photo ID and proof of insurance and any medications you are currently taking- if there are too many medications you can just  bring a list of the medications with dosages. ?Contact information: ?[address] ?[place] [id number] ?[phone] ? ? ?  ?  ? ?  ?  ? ?  ? ?Discharge Instructions   ? ? Ambulatory referral to Neurology   Complete by: As directed ?  ? An appointment is requested in approximately: 8 weeks  ? Ambulatory referral to Physical Therapy   Complete by: As directed ?  ? Diet - low sodium heart healthy   Complete by: As directed ?  ? Discharge instructions   Complete by: As directed ?  ? From Dr. Maryfrances Bunnellanford: ?You were admitted for double vision and imbalance. ?Your MRI of the brain here showed no stroke or other abnormality, but our Neurologist feels that this was likely a small stroke in the part of the brainstem that controls the eyes. ? ?To prevent further  strokes: ?Take aspirin 81 mg from now on ?For the NEXT THREE WEEKS take the "super aspirin" called clopidogrel/Plavix 75 mg daily ?After three weeks, you can stop the clopidogrel and take aspirin by itself from now on ? ?Both aspirin and clopidogrel will keep the platelets (these are part of your blood that start clots) from being too sticky.  This keeps the blood cells "greased" so they don't cause strokes ? ?ALSO, take the cholesterol medicine atoravastatin 10 mg nightly from now on ? ?Get a new primary care doctor as soon as possible ? ?For getting your strength and balance back, go see the physical therapists.   ? ?ALSO: go see a neurologist in 2-3 months ?We sent a referral to [facility] in [place], so they will likely reach out to you to schedule an appointment ?If you would rather, you can call [facility] (Dr. Sherryll BurgerShah, listed below in the To Do section) to schedule an appointment  ? Increase activity slowly   Complete by: As directed ?  ? ?  ? ? ?Discharge Exam: ?Ceasar MonsFiled Weights  ? 09/03/21 1112  ?Weight: 74.8 kg  ? ?General: Pt is alert, awake, not in acute distress ?Cardiovascular: RRR, nl S1-S2, no murmurs appreciated.   No LE edema.   ?Respiratory: Normal respiratory rate and rhythm.  CTAB without rales or wheezes. ?Abdominal: Abdomen soft and non-tender.  No distension or HSM.   ?Neuro/Psych: Strength symmetric in upper and lower extremities.  Judgment and insight appear normal. ? ? ?Condition at discharge: good ? ?The results of significant diagnostics from this hospitalization (including imaging, microbiology, ancillary and laboratory) are listed below for reference.  ? ?Imaging Studies: ?CT HEAD WO CONTRAST ? ?Result Date: 09/03/2021 ?CLINICAL DATA:  Dizziness, double vision EXAM: CT HEAD WITHOUT CONTRAST TECHNIQUE: Contiguous axial images were obtained from the base of the skull through the vertex without intravenous contrast. RADIATION DOSE REDUCTION: This exam was performed  according to the departmental dose-optimization program which includes automated exposure control, adjustment of the mA and/or kV according to patient size and/or use of iterative reconstruction technique. COMPARISON:  None. FINDINGS: Brain: No evidence of acute infarction, hemorrhage, cerebral edema, mass, mass effect, or midline shift. No hydrocephalus or extra-axial fluid collection. Asymmetric ventricular size, with relative enlargement of the left lateral ventricle, which is nonspecific but may represent asymmetric left cerebral hemisphere volume loss, likely chronic. Vascular: No hyperdense vessel. Skull: Normal. Negative for fracture or focal lesion. Sinuses/Orbits: No acute finding. Other: Fluid in the left mastoid air cells. IMPRESSION: IMPRESSION No acute intracranial process. Electronically Signed   By: Elaina PatteeAlison  Vasan M.D.  On: 09/03/2021 11:58  ? ?MR ANGIO HEAD WO CONTRAST ? ?Result Date: 09/03/2021 ?CLINICAL DATA:  Neuro deficit, acute, stroke suspected. Imbalance, headache, and double/blurred vision. EXAM: MRI HEAD WITHOUT CONTRAST MRA HEAD WITHOUT CONTRAST MRA OF THE NECK WITHOUT AND WITH CONTRAST TECHNIQUE: Multiplanar, multi-echo pulse sequences of the brain and surrounding structures were acquired without intravenous contrast. Angiographic images of the Circle of Willis were acquired using MRA technique without intravenous contrast. Angiographic images of the neck were acquired using MRA technique without and with intravenous contrast. Carotid stenosis measurements (when applicable) are obtained utilizing NASCET criteria, using the distal internal carotid diameter as the denominator. CONTRAST:  62mL GADAVIST GADOBUTROL 1 MMOL/ML IV SOLN COMPARISON:  Head CT 09/03/2021 FINDINGS: MR HEAD FINDINGS Brain: There is no evidence of an acute infarct, intracranial hemorrhage, mass, midline shift, or extra-axial fluid collection. Small T2 hyperintensities in the cerebral white matter bilaterally are nonspecific  but compatible with mild chronic small vessel ischemic disease. A chronic lacunar infarct is noted in the right thalamus. There is mild cerebral atrophy. Vascular: Major intracranial vascular flow voids a

## 2021-09-04 NOTE — Discharge Instructions (Signed)
From Dr. Loleta Books:  ?You were admitted for double vision and imbalance.  ?Your MRI of the brain here showed no stroke or other abnormality, but our Neurologist feels that this was likely a small stroke in the part of the brainstem that controls the eyes.  ? ?To prevent further strokes:  ?Take aspirin 81 mg from now on  ?For the NEXT THREE WEEKS take the "super aspirin" called clopidogrel/Plavix 75 mg daily  ?After three weeks, you can stop the clopidogrel and take aspirin by itself from now on  ? ?Both aspirin and clopidogrel will keep the platelets (these are part of your blood that start clots) from being too sticky.  This keeps the blood cells "greased" so they don't cause strokes  ? ?ALSO, take the cholesterol medicine atoravastatin 10 mg nightly from now on  ? ?Get a new primary care doctor as soon as possible  ? ?For getting your strength and balance back, go see the physical therapists.    ? ?ALSO: go see a neurologist in 2-3 months  ?We sent a referral to [facility] in [place], so they will likely reach out to you to schedule an appointment  ?If you would rather, you can call [facility] clinic (Dr. Manuella Ghazi, listed below in the To Do section) to schedule an appointment ?

## 2021-09-04 NOTE — Progress Notes (Signed)
*  PRELIMINARY RESULTS* ?Echocardiogram ?2D Echocardiogram has been performed. ? ?Kita Neace, Sonia Side ?09/04/2021, 2:21 PM ?

## 2021-09-04 NOTE — ED Notes (Signed)
Echo at bedside

## 2021-09-04 NOTE — TOC Initial Note (Signed)
Transition of Care (TOC) - Initial/Assessment Note  ? ? ?Patient Details  ?Name: George Waller ?MRN: [id number] ?Date of Birth: 05/28/1933 ? ?Transition of Care (TOC) CM/SW Contact:    ?Shelbie Hutching, RN ?Phone Number: ?09/04/2021, 3:51 PM ? ?Clinical Narrative:                 ?Patient placed under observation for blurred vision and dizziness.  Patient is now cleared for discharge home.  Met with patient and wife at the bedside in the emergency room, patient is excited to go home.  Patient declines Outpatient PT doesn't feel that he needs therapy. ?Patient does not have a PCP, wife reports calling around and trying to find one but all the offices are either not accepting patients or are weeks to months out with appointments. ? ?RNCM was able to get patient an appointment at [place] for Tuesday April 11th at 1015 with Dr. Lamonte Sakai.  Patient notified that he needs to arrive at 9:45 am.  He verbalizes understanding.   ? ?Wife will transport patient home. ? ?Expected Discharge Plan: Home/Self Care ?Barriers to Discharge: Barriers Resolved ? ? ?Patient Goals and CMS Choice ?Patient states their goals for this hospitalization and ongoing recovery are:: Patient ready to get out of here and get home ?  ?  ? ?Expected Discharge Plan and Services ?Expected Discharge Plan: Home/Self Care ?  ?Discharge Planning Services: CM Consult, Follow-up appt scheduled ?  ?Living arrangements for the past 2 months: [place] ?Expected Discharge Date: 09/04/21               ?DME Arranged: N/A ?DME Agency: NA ?  ?  ?  ?HH Arranged: NA ?[place] Agency: NA ?  ?  ?  ? ?Prior Living Arrangements/Services ?Living arrangements for the past 2 months: [place] ?Lives with:: Spouse ?Patient language and need for interpreter reviewed:: Yes ?Do you feel safe going back to the place where you live?: Yes      ?Need for Family Participation in Patient Care: Yes (Comment) ?Care giver support system in place?: Yes (comment)  (wife) ?  ?Criminal Activity/Legal Involvement Pertinent to Current Situation/Hospitalization: No - Comment as needed ? ?Activities of Daily Living ?  ?  ? ?Permission Sought/Granted ?Permission sought to share information with : Case Manager, Family Supports, Customer service manager ?Permission granted to share information with : Yes, Verbal Permission Granted ? Share Information with NAME: Daphane Shepherd ? Permission granted to share info w AGENCY: [facility] [place] ? Permission granted to share info w Relationship: spouse ? Permission granted to share info w Contact Information: [phone] ? ?Emotional Assessment ?Appearance:: Appears stated age ?Attitude/Demeanor/Rapport: Engaged ?Affect (typically observed): Accepting, Pleasant ?Orientation: : Oriented to Self, Oriented to  Time, Oriented to Place, Oriented to Situation ?Alcohol / Substance Use: Not Applicable ?Psych Involvement: No (comment) ? ?Admission diagnosis:  Balance problem [R26.89] ?Patient Active Problem List  ? Diagnosis Date Noted  ? AKI (acute kidney injury) ([place]) 09/04/2021  ? Balance problem 09/03/2021  ? Diplopia 09/03/2021  ? ?PCP:  Perrin Maltese, MD ?Pharmacy:   ?[facility] [place], [place] AT [facility] [address] ?[place] ?[place] [address] ?Phone: [phone] Fax: [phone] ? ? ? ? ?Social Determinants of Health (SDOH) Interventions ?  ? ?Readmission Risk Interventions ?   ? View : No data to display.  ?  ?  ?  ? ? ? ?

## 2021-09-04 NOTE — Progress Notes (Signed)
Neurology Progress Note ? ?Patient ID: George Waller is a 88 y.o. with PMHx of  has a past medical history of Kidney stones. ? ? ?Subjective: ?-Double vision has resolved ?-No new complaints ? ?Exam: ?Vitals:  ? 09/04/21 0445 09/04/21 0828  ?BP: (!) 161/101 (!) 165/100  ?Pulse: (!) 46 (!) 50  ?Resp: 16 17  ?Temp:  98.1 ?F (36.7 ?C)  ?SpO2: 95% 98%  ? ?Gen: In bed, comfortable  ?Resp: non-labored breathing, no grossly audible wheezing ?Cardiac: Perfusing extremities well  ?Abd: soft, nt ? ?Neuro: ?MS: Awake, alert, oriented, speech fluent ?CN: EOMI now intact, no longer has ptosis, pupils equal round reactive to light and symmetric ?Motor: No pronator drift, moving all 4 extremities grossly equally ?Gait: Now at his baseline casual gait, improved steadiness ? ?Pertinent Labs: ? ? ?Basic Metabolic Panel: ?Recent Labs  ?Lab 09/03/21 ?1118 09/03/21 ?1938  ?NA 140  --   ?K 4.2  --   ?CL 109  --   ?CO2 25  --   ?GLUCOSE 110*  --   ?BUN 24*  --   ?CREATININE 1.39* 1.46*  ?CALCIUM 9.1  --   ? ? ?CBC: ?Recent Labs  ?Lab 09/03/21 ?1118 09/03/21 ?1938  ?WBC 7.9 9.2  ?NEUTROABS 3.8  --   ?HGB 16.0 16.2  ?HCT 49.6 49.6  ?MCV 92.9 92.7  ?PLT 235 235  ? ? ?Coagulation Studies: ?Recent Labs  ?  09/03/21 ?1118  ?LABPROT 14.6  ?INR 1.1  ?  ? ? ?No results found for: HGBA1C  ? ?Lab Results  ?Component Value Date  ? CHOL 136 09/04/2021  ? HDL 29 (L) 09/04/2021  ? LDLCALC 92 09/04/2021  ? TRIG 73 09/04/2021  ? CHOLHDL 4.7 09/04/2021  ? ?MRI brain and MRA head and neck personally reviewed, agree with radiology: ?1. No acute intracranial abnormality. ?2. Mild chronic small vessel ischemic disease. ?3. Negative head MRA. ?4. Negative neck MRA. ? ?Impression: Patient has made a full recovery, fortunately.  This is consistent with a very small brainstem stroke.  Possibility of a 3rd nerve palsy could also be considered but I think this is less likely given the ataxia he had on my initial evaluation. ? ?Recommendations: ?-Dual  antiplatelet therapy for 21 days followed by aspirin monotherapy 81 mg lifelong ?-Atorvastatin 10 mg to minimize side effects given LDL only slightly above goal at 92 ?-Pending completion of echocardiogram, patient can be discharged with close outpatient follow-up with PCP ?-Outpatient neurology follow up in 2-3 months, referrral to [facility] placed -- alternatively patient may follow-up with [facility] if they prefer ? ?Brooke Dare MD-PhD ?[facility] ?[phone]  ?[facility] coverage for [facility] is from 8 AM to 4 AM in-house and 4 PM to 8 PM by telephone/video. 8 PM to 8 AM emergent questions or overnight urgent questions should be addressed to Teleneurology On-call or [facility] neurohospitalist; contact information can be found on AMION ? ?Greater than 35 minutes were spent in care of this patient today, greater than 50% at bedside and discussion of work-up and prognosis with patient and family at bedside ? ?

## 2021-09-04 NOTE — Assessment & Plan Note (Addendum)
Lab Results  ?Component Value Date  ? CREATININE 1.46 (H) 09/03/2021  ? CREATININE 1.39 (H) 09/03/2021  ? CREATININE 2.34 (H) 02/13/2013  ?stable we will follow.  ?Advised pt to stop goody bc and all nsaid products.  ?

## 2021-09-04 NOTE — ED Notes (Signed)
Hospitalist at bedside with pt and family

## 2021-09-05 LAB — HEMOGLOBIN A1C
Hgb A1c MFr Bld: 6.4 % — ABNORMAL HIGH (ref 4.8–5.6)
Mean Plasma Glucose: 137 mg/dL

## 2021-10-23 ENCOUNTER — Ambulatory Visit: Payer: Medicare Other | Admitting: Diagnostic Neuroimaging

## 2021-11-20 ENCOUNTER — Ambulatory Visit: Payer: Medicare Other | Admitting: Diagnostic Neuroimaging

## 2021-11-20 ENCOUNTER — Encounter: Payer: Self-pay | Admitting: Diagnostic Neuroimaging

## 2021-11-20 VITALS — BP 152/70 | HR 44 | Ht 68.0 in | Wt 150.8 lb

## 2021-11-20 DIAGNOSIS — G459 Transient cerebral ischemic attack, unspecified: Secondary | ICD-10-CM | POA: Diagnosis not present

## 2021-11-20 DIAGNOSIS — H532 Diplopia: Secondary | ICD-10-CM | POA: Diagnosis not present

## 2021-11-20 NOTE — Patient Instructions (Signed)
  Brainstem TIA (or MRI negative stroke) - continue aspirin 81mg  daily - consider atorvastatin 10mg  (patient declines at this time; reasonable given LDL 27 and age 88 years old) - reviewed nutrition and exercise options
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
## 2021-11-27 ENCOUNTER — Ambulatory Visit: Payer: Medicare Other | Admitting: Diagnostic Neuroimaging

## 2022-02-08 ENCOUNTER — Other Ambulatory Visit: Payer: Self-pay | Admitting: Nephrology

## 2022-02-08 DIAGNOSIS — N179 Acute kidney failure, unspecified: Secondary | ICD-10-CM

## 2022-02-08 DIAGNOSIS — E1122 Type 2 diabetes mellitus with diabetic chronic kidney disease: Secondary | ICD-10-CM

## 2022-02-08 DIAGNOSIS — N200 Calculus of kidney: Secondary | ICD-10-CM

## 2022-02-08 DIAGNOSIS — R8290 Unspecified abnormal findings in urine: Secondary | ICD-10-CM

## 2022-02-15 ENCOUNTER — Ambulatory Visit
Admission: RE | Admit: 2022-02-15 | Discharge: 2022-02-15 | Disposition: A | Discharge status: Home / Self Care | Payer: Medicare Other | Source: Ambulatory Visit | Attending: Nephrology | Admitting: Nephrology

## 2022-02-15 DIAGNOSIS — R8290 Unspecified abnormal findings in urine: Secondary | ICD-10-CM | POA: Diagnosis present

## 2022-02-15 DIAGNOSIS — N179 Acute kidney failure, unspecified: Secondary | ICD-10-CM | POA: Diagnosis present

## 2022-02-15 DIAGNOSIS — N200 Calculus of kidney: Secondary | ICD-10-CM | POA: Diagnosis present

## 2022-02-15 DIAGNOSIS — E1122 Type 2 diabetes mellitus with diabetic chronic kidney disease: Secondary | ICD-10-CM | POA: Insufficient documentation

## 2022-04-12 DIAGNOSIS — R7302 Impaired glucose tolerance (oral): Secondary | ICD-10-CM | POA: Diagnosis not present

## 2022-04-12 DIAGNOSIS — G450 Vertebro-basilar artery syndrome: Secondary | ICD-10-CM | POA: Diagnosis not present

## 2022-08-12 ENCOUNTER — Ambulatory Visit (INDEPENDENT_AMBULATORY_CARE_PROVIDER_SITE_OTHER): Payer: Medicare Other | Admitting: Internal Medicine

## 2022-08-12 ENCOUNTER — Encounter: Payer: Self-pay | Admitting: Internal Medicine

## 2022-08-12 VITALS — BP 130/68 | HR 49 | Ht 67.0 in | Wt 151.2 lb

## 2022-08-12 DIAGNOSIS — G4762 Sleep related leg cramps: Secondary | ICD-10-CM | POA: Insufficient documentation

## 2022-08-12 DIAGNOSIS — G450 Vertebro-basilar artery syndrome: Secondary | ICD-10-CM | POA: Diagnosis not present

## 2022-08-12 DIAGNOSIS — R7302 Impaired glucose tolerance (oral): Secondary | ICD-10-CM

## 2022-08-12 DIAGNOSIS — N400 Enlarged prostate without lower urinary tract symptoms: Secondary | ICD-10-CM | POA: Insufficient documentation

## 2022-08-12 MED ORDER — GABAPENTIN 100 MG PO CAPS: 200.0000 mg | ORAL_CAPSULE | Freq: Every day | ORAL | 3 refills | Status: DC
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
## 2022-08-13 LAB — CBC WITH DIFFERENTIAL
Basophils Absolute: 0.1 10*3/uL (ref 0.0–0.2)
Basos: 1 %
EOS (ABSOLUTE): 1.2 10*3/uL — ABNORMAL HIGH (ref 0.0–0.4)
Eos: 11 %
Hematocrit: 49.9 % (ref 37.5–51.0)
Hemoglobin: 16.2 g/dL (ref 13.0–17.7)
Immature Grans (Abs): 0 10*3/uL (ref 0.0–0.1)
Immature Granulocytes: 0 %
Lymphocytes Absolute: 1.8 10*3/uL (ref 0.7–3.1)
Lymphs: 18 %
MCH: 30.5 pg (ref 26.6–33.0)
MCHC: 32.5 g/dL (ref 31.5–35.7)
MCV: 94 fL (ref 79–97)
Monocytes Absolute: 0.9 10*3/uL (ref 0.1–0.9)
Monocytes: 8 %
Neutrophils Absolute: 6.4 10*3/uL (ref 1.4–7.0)
Neutrophils: 62 %
RBC: 5.31 x10E6/uL (ref 4.14–5.80)
RDW: 11.9 % (ref 11.6–15.4)
WBC: 10.3 10*3/uL (ref 3.4–10.8)

## 2022-08-13 LAB — CMP14+EGFR
ALT: 16 IU/L (ref 0–44)
AST: 18 IU/L (ref 0–40)
Albumin/Globulin Ratio: 1.6 (ref 1.2–2.2)
Albumin: 4.3 g/dL (ref 3.7–4.7)
Alkaline Phosphatase: 112 IU/L (ref 44–121)
BUN/Creatinine Ratio: 19 (ref 10–24)
BUN: 25 mg/dL (ref 8–27)
Bilirubin Total: 0.4 mg/dL (ref 0.0–1.2)
CO2: 22 mmol/L (ref 20–29)
Calcium: 9.7 mg/dL (ref 8.6–10.2)
Chloride: 103 mmol/L (ref 96–106)
Creatinine, Ser: 1.35 mg/dL — ABNORMAL HIGH (ref 0.76–1.27)
Globulin, Total: 2.7 g/dL (ref 1.5–4.5)
Glucose: 128 mg/dL — ABNORMAL HIGH (ref 70–99)
Potassium: 5.1 mmol/L (ref 3.5–5.2)
Sodium: 139 mmol/L (ref 134–144)
Total Protein: 7 g/dL (ref 6.0–8.5)
eGFR: 51 mL/min/{1.73_m2} — ABNORMAL LOW (ref >59)

## 2022-08-13 LAB — LIPID PANEL W/O CHOL/HDL RATIO
Cholesterol, Total: 160 mg/dL (ref 100–199)
HDL: 39 mg/dL — ABNORMAL LOW (ref >39)
LDL Chol Calc (NIH): 106 mg/dL — ABNORMAL HIGH (ref 0–99)
Triglycerides: 77 mg/dL (ref 0–149)
VLDL Cholesterol Cal: 15 mg/dL (ref 5–40)

## 2022-08-13 LAB — HEMOGLOBIN A1C
Est. average glucose Bld gHb Est-mCnc: 131 mg/dL
Hgb A1c MFr Bld: 6.2 % — ABNORMAL HIGH (ref 4.8–5.6)

## 2022-12-12 ENCOUNTER — Ambulatory Visit: Payer: Medicare Other | Admitting: Internal Medicine

## 2023-01-23 IMAGING — MR MR MRA HEAD W/O CM
1 series · 18 of 48 positions shown · IV contrast (gadavist)
Comparison: Head CT 09/03/2021

CLINICAL DATA: Neuro deficit, acute, stroke suspected. Imbalance,
headache, and double/blurred vision.

EXAM:
MRI HEAD WITHOUT CONTRAST
MRA HEAD WITHOUT CONTRAST
MRA OF THE NECK WITHOUT AND WITH CONTRAST
TECHNIQUE: Multiplanar, multi-echo pulse sequences of the brain and surrounding
structures were acquired without intravenous contrast. Angiographic
images of the Circle of Willis were acquired using MRA technique
without intravenous contrast. Angiographic images of the neck were
acquired using MRA technique without and with intravenous contrast.
Carotid stenosis measurements (when applicable) are obtained
utilizing NASCET criteria, using the distal internal carotid
diameter as the denominator.
CONTRAST:  7mL GADAVIST GADOBUTROL 1 MMOL/ML IV SOLN

[Series 5: TOF · axial · 0.5mm · 0.41mm/px · z∈[-91,+3]mm · 18 of 205 slices shown]
[im 1/205]
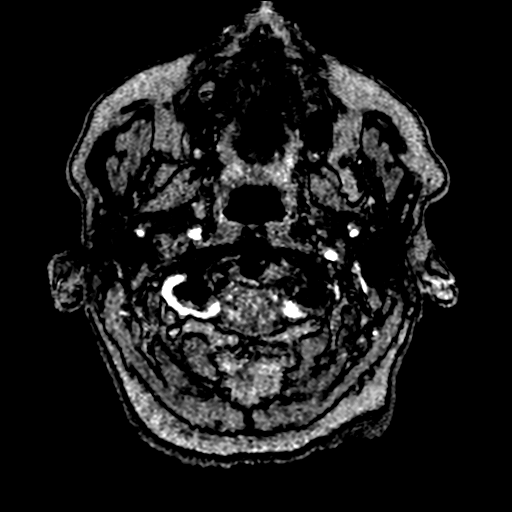
[im 5/205]
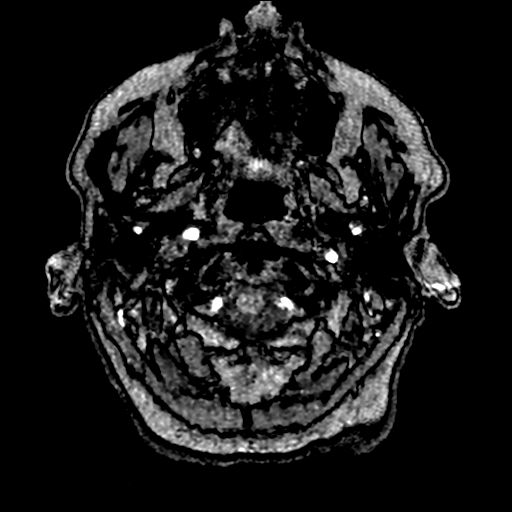
[im 9/205]
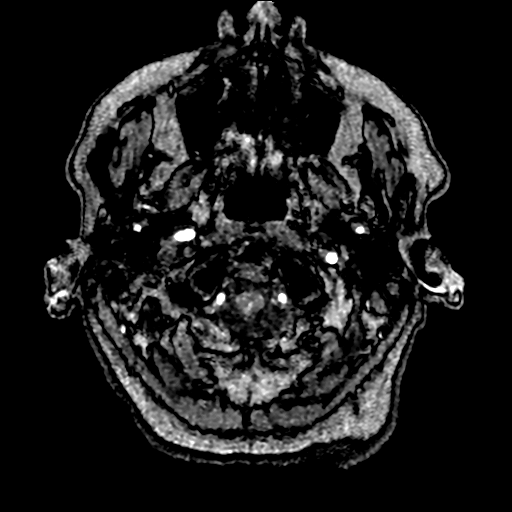
[im 14/205]
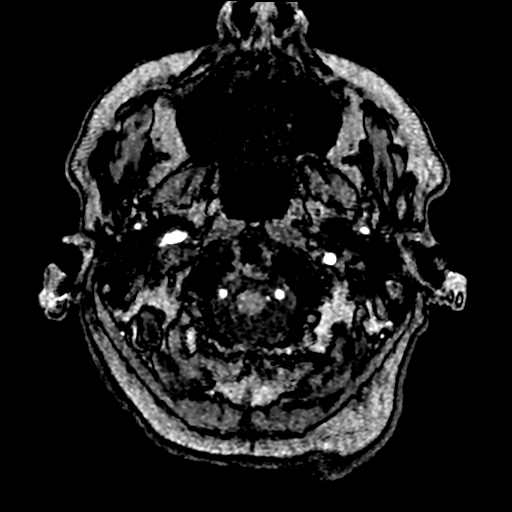
[im 18/205]
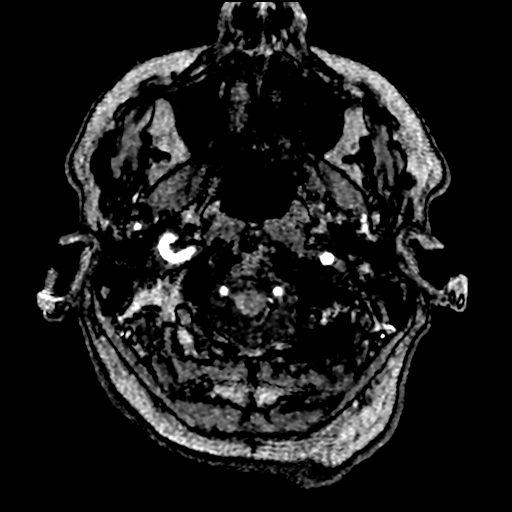
[im 22/205]
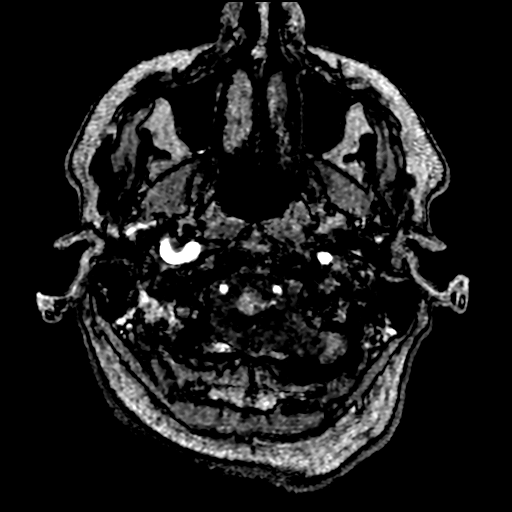
[im 27/205]
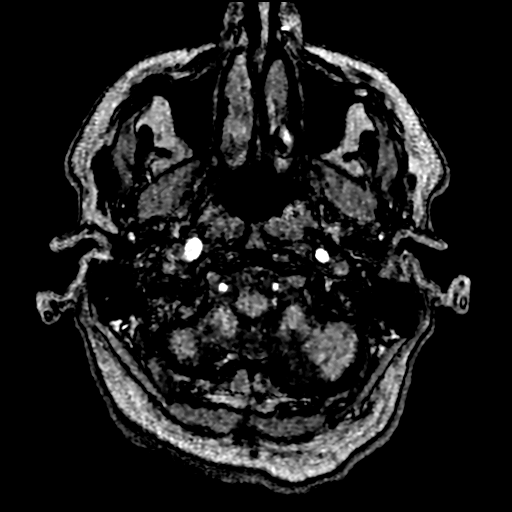
[im 31/205]
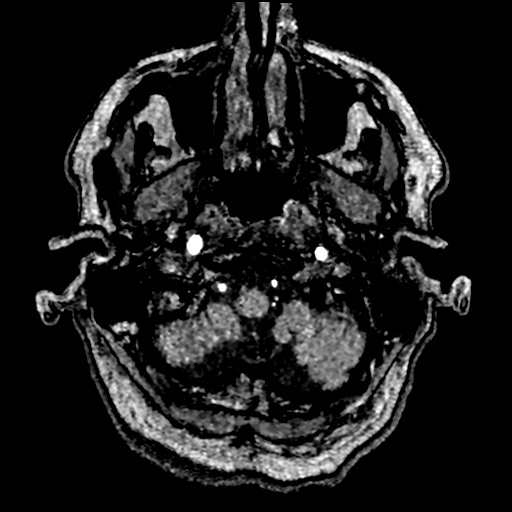
[im 35/205]
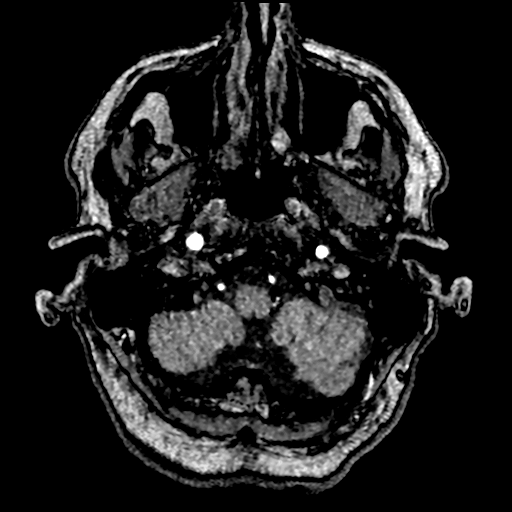
[im 40/205]
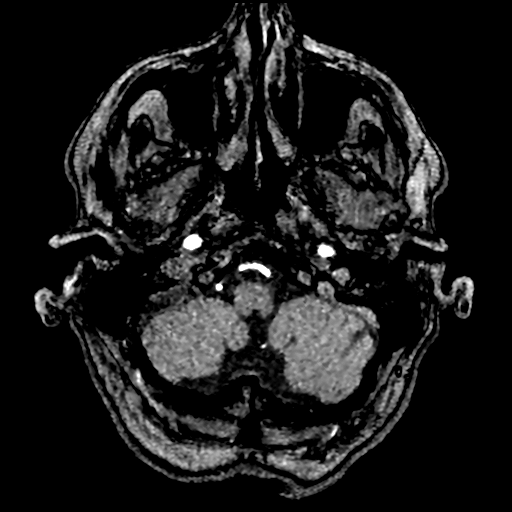
[im 66/205]
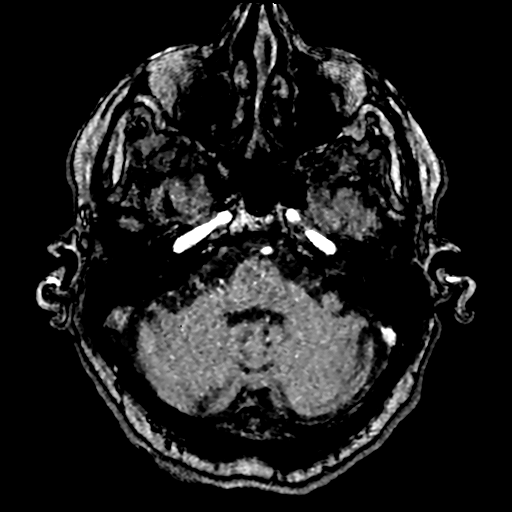
[im 92/205]
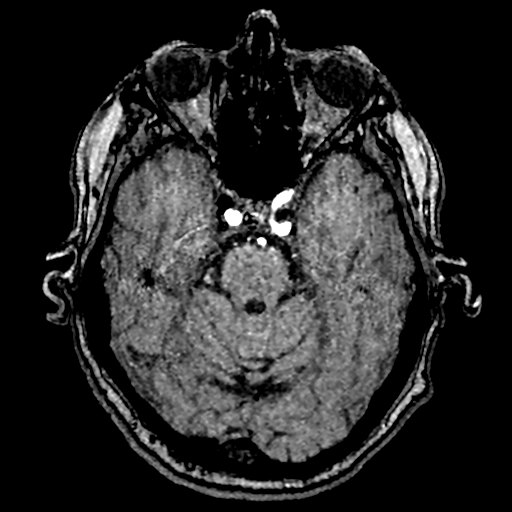
[im 105/205]
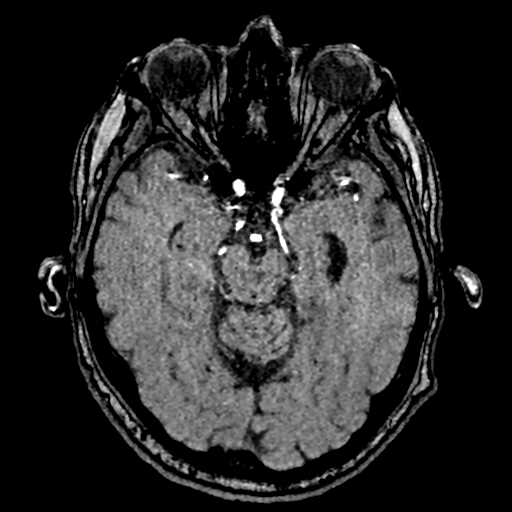
[im 118/205]
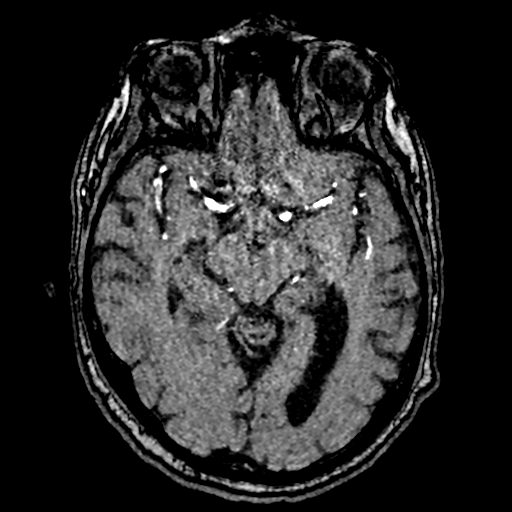
[im 144/205]
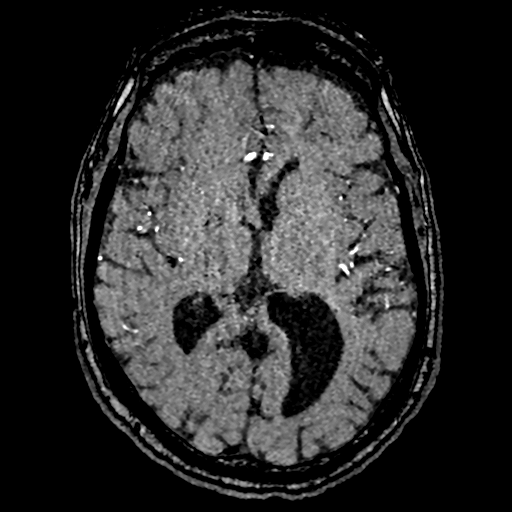
[im 170/205]
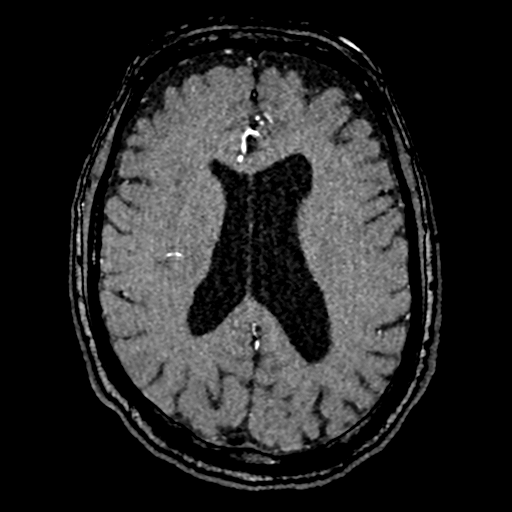
[im 174/205]
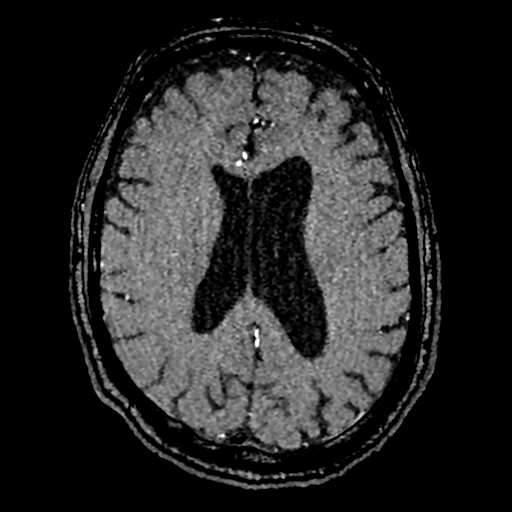
[im 196/205]
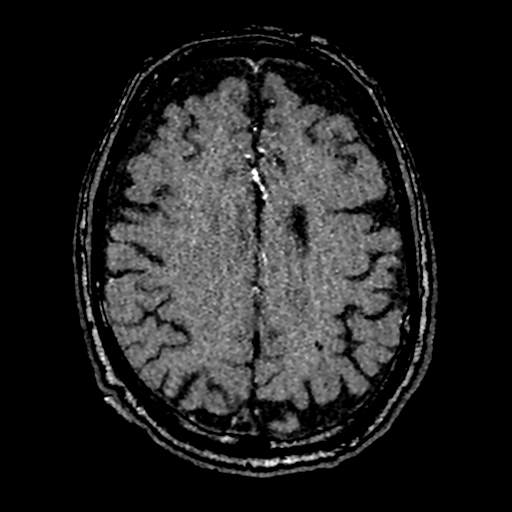

[18 of 48 positions shown; findings below may reference images not displayed]

FINDINGS: MR HEAD FINDINGS

Brain: There is no evidence of an acute infarct, intracranial
hemorrhage, mass, midline shift, or extra-axial fluid collection.
Small T2 hyperintensities in the cerebral white matter bilaterally
are nonspecific but compatible with mild chronic small vessel
ischemic disease. A chronic lacunar infarct is noted in the right
thalamus. There is mild cerebral atrophy.

Vascular: Major intracranial vascular flow voids are preserved.

Skull and upper cervical spine: Unremarkable bone marrow signal.

Sinuses/Orbits: Unremarkable orbits. Trace right and small left
mastoid effusions.

Other: None.

MRA HEAD FINDINGS

The intracranial vertebral arteries are widely patent to the
basilar. Patent PICA and CA origins are seen bilaterally. The
basilar artery is widely patent. There are left larger than right
posterior communicating arteries with hypoplasia of the left P1
segment. Both PCAs are patent without evidence of a significant
proximal stenosis.

ACAs and MCAs are patent without evidence of a proximal branch
occlusion or significant A1 or left M1 stenosis. A possible focal
stenosis of the distal right M1 segment is favored to be artifactual
given normal appearance of this region on the contrast-enhanced neck
MRA. No aneurysm is identified.

MRA NECK FINDINGS

There is a standard 3 vessel aortic arch. The brachiocephalic and
subclavian arteries are widely patent.

The common carotid and internal carotid arteries are patent and
smooth without evidence of stenosis or dissection.

The vertebral arteries are patent and codominant with antegrade flow
bilaterally and no evidence of a significant stenosis or aneurysm.
IMPRESSION: 1. No acute intracranial abnormality.
2. Mild chronic small vessel ischemic disease.
3. Negative head MRA.
4. Negative neck MRA.

## 2023-01-23 IMAGING — MR MR HEAD W/O CM
13 series · 48 of 48 positions shown · IV contrast (gadavist)
Comparison: Head CT 09/03/2021

CLINICAL DATA: Neuro deficit, acute, stroke suspected. Imbalance,
headache, and double/blurred vision.

EXAM:
MRI HEAD WITHOUT CONTRAST
MRA HEAD WITHOUT CONTRAST
MRA OF THE NECK WITHOUT AND WITH CONTRAST
TECHNIQUE: Multiplanar, multi-echo pulse sequences of the brain and surrounding
structures were acquired without intravenous contrast. Angiographic
images of the Circle of Willis were acquired using MRA technique
without intravenous contrast. Angiographic images of the neck were
acquired using MRA technique without and with intravenous contrast.
Carotid stenosis measurements (when applicable) are obtained
utilizing NASCET criteria, using the distal internal carotid
diameter as the denominator.
CONTRAST:  7mL GADAVIST GADOBUTROL 1 MMOL/ML IV SOLN

[Series 5: ax dwi_tracew · axial · 3.0mm · 1.80mm/px · z∈[-89,+61]mm · 2 of 48 slices shown]
[im 1/48]
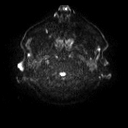
[im 48/48]
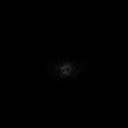

[Series 6: ax dwi_adc · axial · 3.0mm · 1.80mm/px · z∈[-89,+61]mm · 3 of 47 slices shown]
[im 1/47]
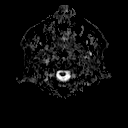
[im 24/47]
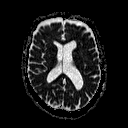
[im 47/47]
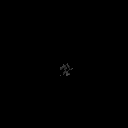

[Series 7: cor dwi_tracew · coronal · 5.0mm · 1.80mm/px · 3 of 38 slices shown]
[im 1/38]
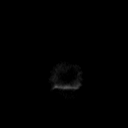
[im 19/38]
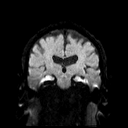
[im 38/38]
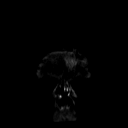

[Series 8: cor dwi_adc · coronal · 5.0mm · 1.80mm/px · 3 of 38 slices shown]
[im 1/38]
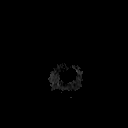
[im 19/38]
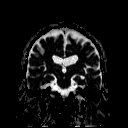
[im 38/38]
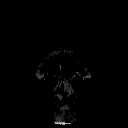

[Series 9: T1 · sagittal · 5.0mm · 0.62mm/px · 2 of 23 slices shown (1 of 2)]
[im 1/23]
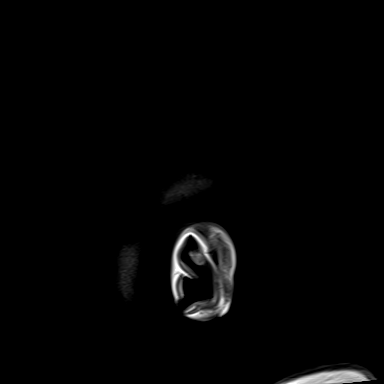
[im 23/23]
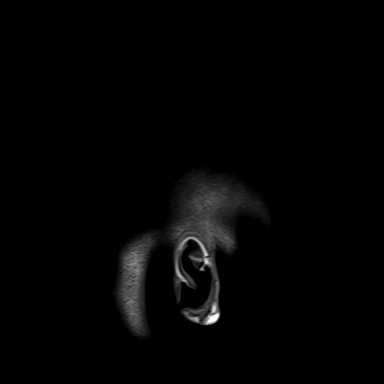

[Series 10: T2 · axial · 5.0mm · 0.86mm/px · z∈[-84,+55]mm · 2 of 25 slices shown (1 of 2)]
[im 1/25]
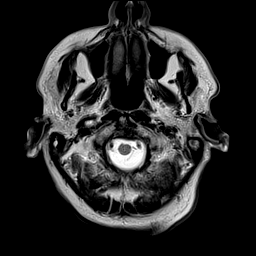
[im 25/25]
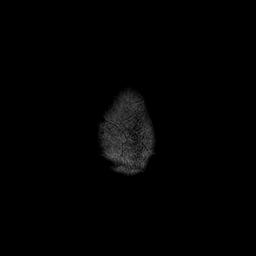

[Series 11: mag_images · axial · 3.0mm · 0.90mm/px · z∈[-87,+61]mm · 4 of 52 slices shown]
[im 1/52]
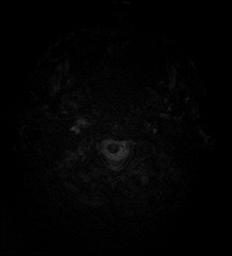
[im 18/52]
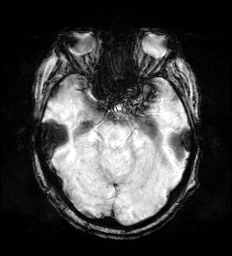
[im 35/52]
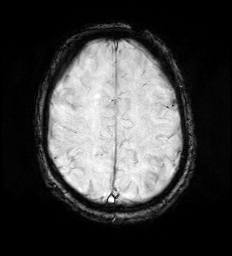
[im 52/52]
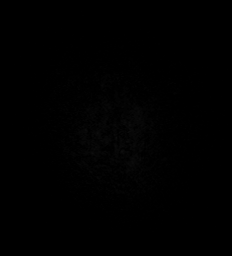

[Series 12: pha_images · axial · 3.0mm · 0.90mm/px · z∈[-87,+61]mm · 4 of 52 slices shown]
[im 1/52]
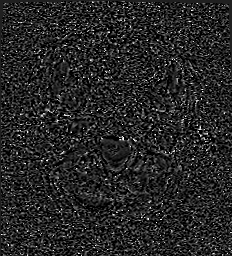
[im 18/52]
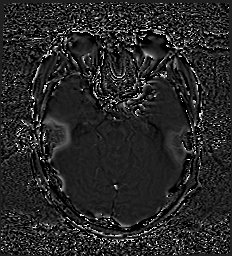
[im 35/52]
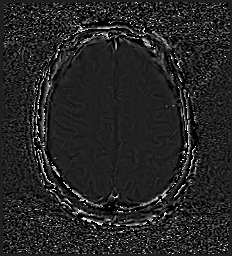
[im 52/52]
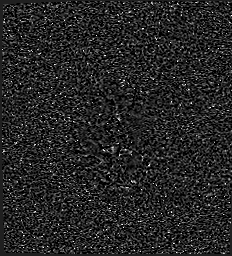

[Series 13: swi_images · axial · 3.0mm · 0.90mm/px · z∈[-87,+61]mm · 4 of 52 slices shown]
[im 1/52]
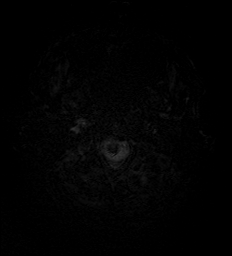
[im 18/52]
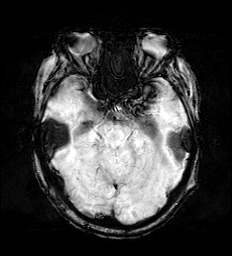
[im 35/52]
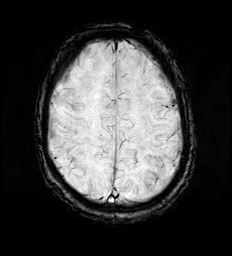
[im 52/52]
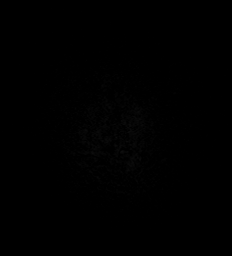

[Series 14: mip_images(sw) · axial · 24.0mm · 0.90mm/px · z∈[-76,+51]mm · 3 of 45 slices shown]
[im 1/45]
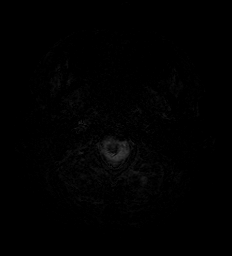
[im 23/45]
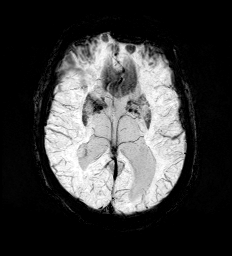
[im 45/45]
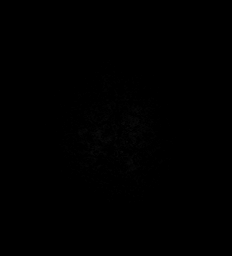

[Series 15: FLAIR · axial · 3.0mm · 0.69mm/px · z∈[-93,+64]mm · 4 of 55 slices shown]
[im 1/55]
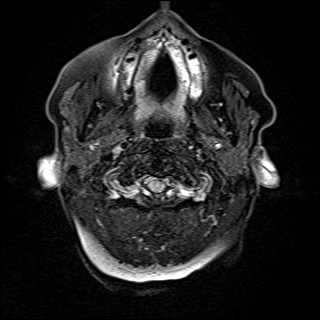
[im 19/55]
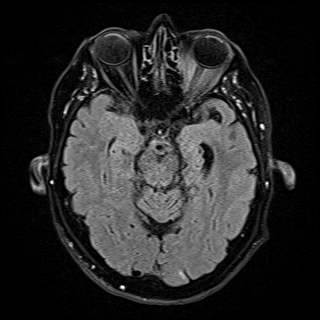
[im 37/55]
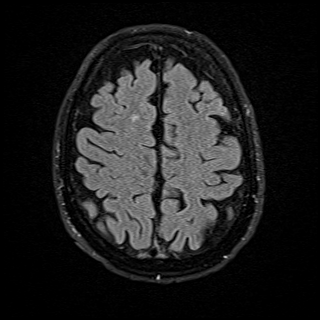
[im 55/55]
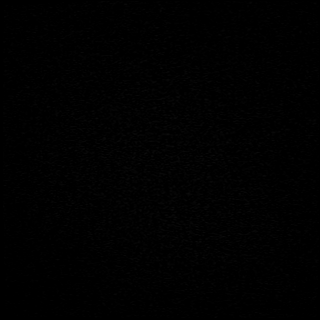

[Series 16: T1 · axial · 1.0mm · 0.98mm/px · z∈[-97,+72]mm · 12 of 175 slices shown (2 of 2)]
[im 1/175]
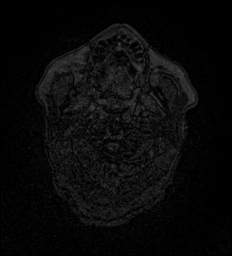
[im 16/175]
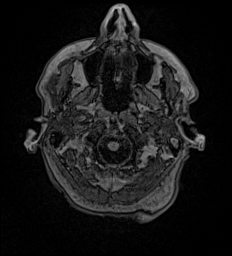
[im 32/175]
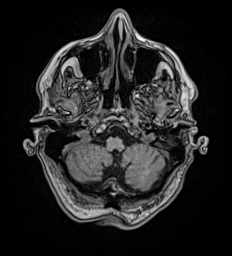
[im 48/175]
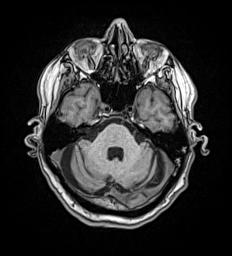
[im 64/175]
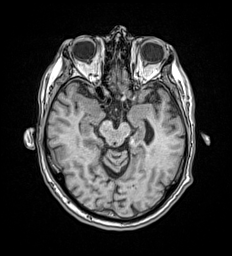
[im 80/175]
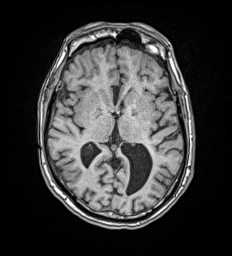
[im 95/175]
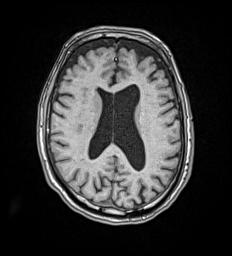
[im 111/175]
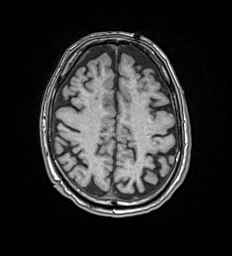
[im 127/175]
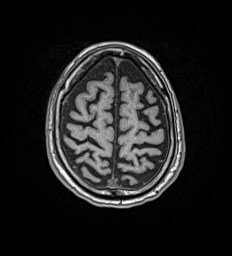
[im 143/175]
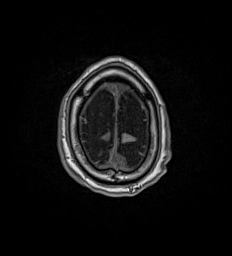
[im 159/175]
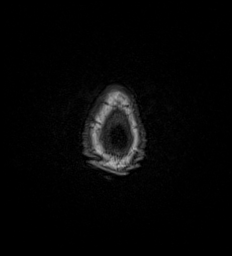
[im 175/175]
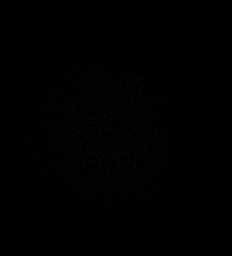

[Series 17: T2 · coronal · 5.0mm · 0.86mm/px · 2 of 29 slices shown (2 of 2)]
[im 1/29]
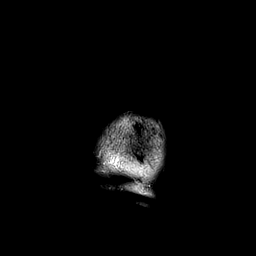
[im 29/29]
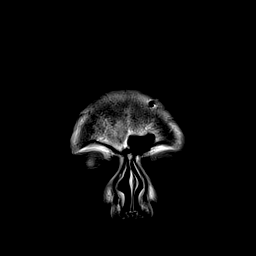

[48 of 48 positions shown; findings below may reference images not displayed]

FINDINGS: MR HEAD FINDINGS

Brain: There is no evidence of an acute infarct, intracranial
hemorrhage, mass, midline shift, or extra-axial fluid collection.
Small T2 hyperintensities in the cerebral white matter bilaterally
are nonspecific but compatible with mild chronic small vessel
ischemic disease. A chronic lacunar infarct is noted in the right
thalamus. There is mild cerebral atrophy.

Vascular: Major intracranial vascular flow voids are preserved.

Skull and upper cervical spine: Unremarkable bone marrow signal.

Sinuses/Orbits: Unremarkable orbits. Trace right and small left
mastoid effusions.

Other: None.

MRA HEAD FINDINGS

The intracranial vertebral arteries are widely patent to the
basilar. Patent PICA and CA origins are seen bilaterally. The
basilar artery is widely patent. There are left larger than right
posterior communicating arteries with hypoplasia of the left P1
segment. Both PCAs are patent without evidence of a significant
proximal stenosis.

ACAs and MCAs are patent without evidence of a proximal branch
occlusion or significant A1 or left M1 stenosis. A possible focal
stenosis of the distal right M1 segment is favored to be artifactual
given normal appearance of this region on the contrast-enhanced neck
MRA. No aneurysm is identified.

MRA NECK FINDINGS

There is a standard 3 vessel aortic arch. The brachiocephalic and
subclavian arteries are widely patent.

The common carotid and internal carotid arteries are patent and
smooth without evidence of stenosis or dissection.

The vertebral arteries are patent and codominant with antegrade flow
bilaterally and no evidence of a significant stenosis or aneurysm.
IMPRESSION: 1. No acute intracranial abnormality.
2. Mild chronic small vessel ischemic disease.
3. Negative head MRA.
4. Negative neck MRA.

## 2023-01-23 IMAGING — MR MR MRA NECK WO/W CM
2 series · 19 of 19 positions shown · IV contrast (gadavist)
Comparison: Head CT 09/03/2021

CLINICAL DATA: Neuro deficit, acute, stroke suspected. Imbalance,
headache, and double/blurred vision.

EXAM:
MRI HEAD WITHOUT CONTRAST
MRA HEAD WITHOUT CONTRAST
MRA OF THE NECK WITHOUT AND WITH CONTRAST
TECHNIQUE: Multiplanar, multi-echo pulse sequences of the brain and surrounding
structures were acquired without intravenous contrast. Angiographic
images of the Circle of Willis were acquired using MRA technique
without intravenous contrast. Angiographic images of the neck were
acquired using MRA technique without and with intravenous contrast.
Carotid stenosis measurements (when applicable) are obtained
utilizing NASCET criteria, using the distal internal carotid
diameter as the denominator.
CONTRAST:  7mL GADAVIST GADOBUTROL 1 MMOL/ML IV SOLN

[Series 1084: rcca · 0.61mm/px · 11 of 11 slices shown]
[im 1/11]
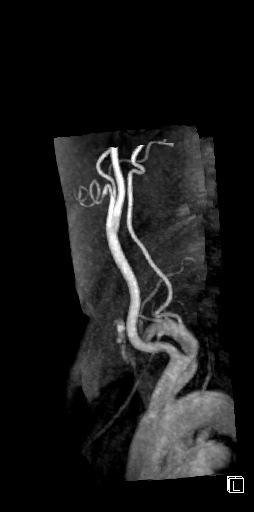
[im 2/11]
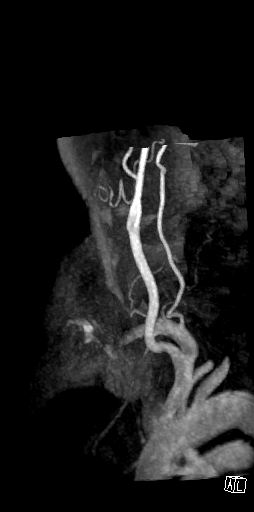
[im 3/11]
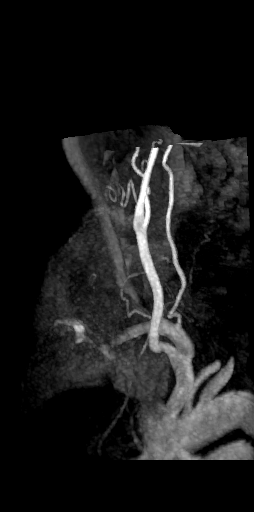
[im 4/11]
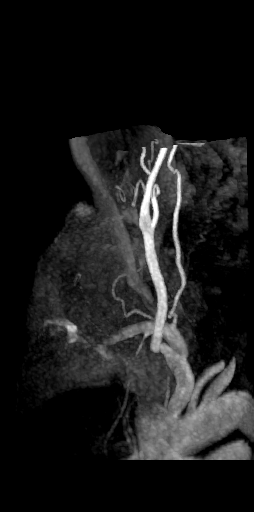
[im 5/11]
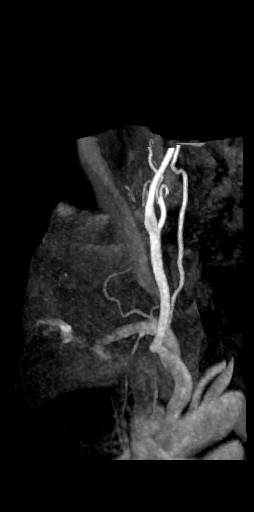
[im 6/11]
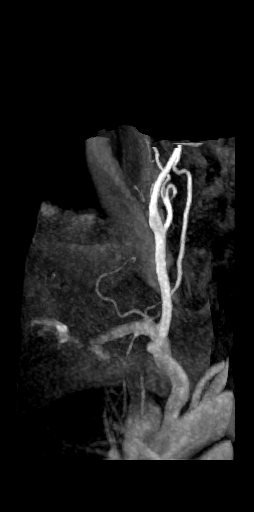
[im 7/11]
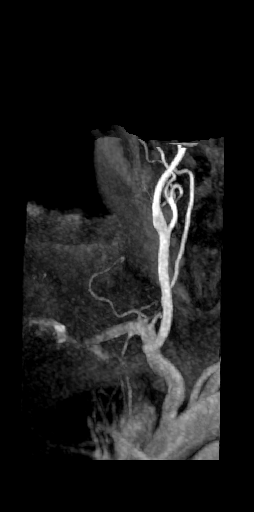
[im 8/11]
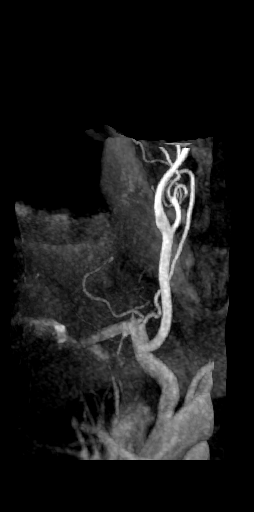
[im 9/11]
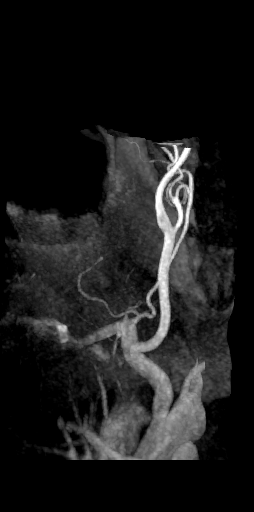
[im 10/11]
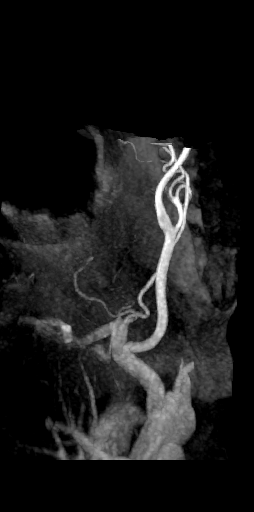
[im 11/11]
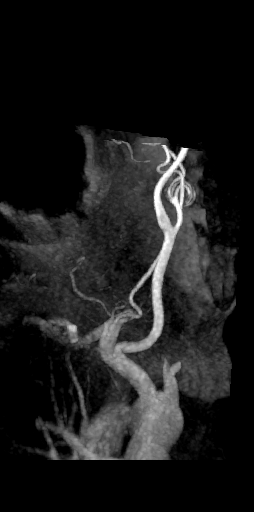

[Series 1088: lcca · 0.59mm/px · 8 of 8 slices shown]
[im 1/8]
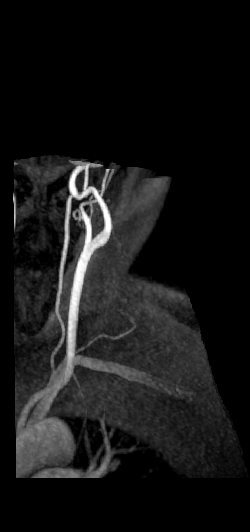
[im 2/8]
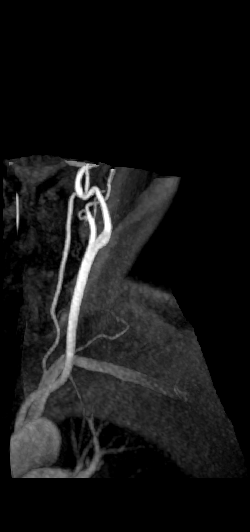
[im 3/8]
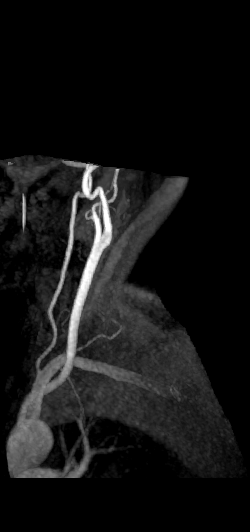
[im 4/8]
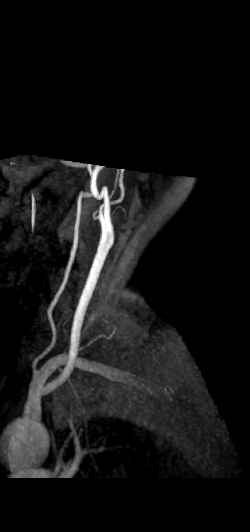
[im 5/8]
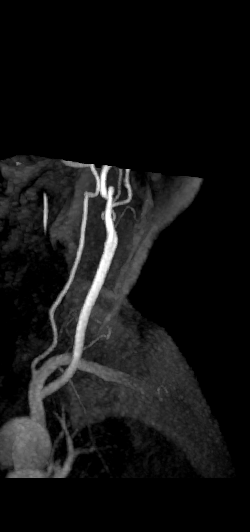
[im 6/8]
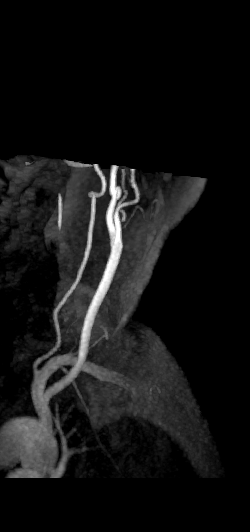
[im 7/8]
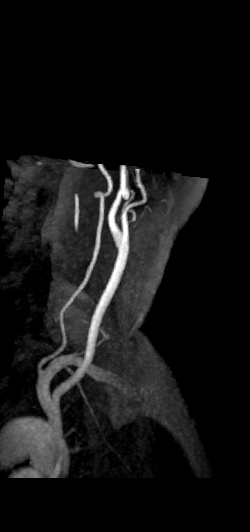
[im 8/8]
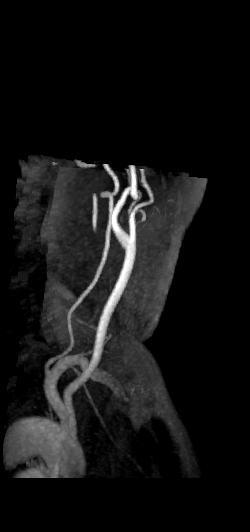

[19 of 19 positions shown; findings below may reference images not displayed]

FINDINGS: MR HEAD FINDINGS

Brain: There is no evidence of an acute infarct, intracranial
hemorrhage, mass, midline shift, or extra-axial fluid collection.
Small T2 hyperintensities in the cerebral white matter bilaterally
are nonspecific but compatible with mild chronic small vessel
ischemic disease. A chronic lacunar infarct is noted in the right
thalamus. There is mild cerebral atrophy.

Vascular: Major intracranial vascular flow voids are preserved.

Skull and upper cervical spine: Unremarkable bone marrow signal.

Sinuses/Orbits: Unremarkable orbits. Trace right and small left
mastoid effusions.

Other: None.

MRA HEAD FINDINGS

The intracranial vertebral arteries are widely patent to the
basilar. Patent PICA and CA origins are seen bilaterally. The
basilar artery is widely patent. There are left larger than right
posterior communicating arteries with hypoplasia of the left P1
segment. Both PCAs are patent without evidence of a significant
proximal stenosis.

ACAs and MCAs are patent without evidence of a proximal branch
occlusion or significant A1 or left M1 stenosis. A possible focal
stenosis of the distal right M1 segment is favored to be artifactual
given normal appearance of this region on the contrast-enhanced neck
MRA. No aneurysm is identified.

MRA NECK FINDINGS

There is a standard 3 vessel aortic arch. The brachiocephalic and
subclavian arteries are widely patent.

The common carotid and internal carotid arteries are patent and
smooth without evidence of stenosis or dissection.

The vertebral arteries are patent and codominant with antegrade flow
bilaterally and no evidence of a significant stenosis or aneurysm.
IMPRESSION: 1. No acute intracranial abnormality.
2. Mild chronic small vessel ischemic disease.
3. Negative head MRA.
4. Negative neck MRA.

## 2023-02-12 ENCOUNTER — Emergency Department
Admission: EM | Admit: 2023-02-12 | Discharge: 2023-02-12 | Disposition: A | Discharge status: Home / Self Care | Payer: Medicare Other | Attending: Emergency Medicine | Admitting: Emergency Medicine

## 2023-02-12 ENCOUNTER — Encounter: Payer: Self-pay | Admitting: Medical Oncology

## 2023-02-12 ENCOUNTER — Emergency Department: Payer: Medicare Other

## 2023-02-12 DIAGNOSIS — K8689 Other specified diseases of pancreas: Secondary | ICD-10-CM | POA: Diagnosis not present

## 2023-02-12 DIAGNOSIS — R339 Retention of urine, unspecified: Secondary | ICD-10-CM | POA: Diagnosis not present

## 2023-02-12 DIAGNOSIS — K4090 Unilateral inguinal hernia, without obstruction or gangrene, not specified as recurrent: Secondary | ICD-10-CM | POA: Diagnosis not present

## 2023-02-12 DIAGNOSIS — R338 Other retention of urine: Secondary | ICD-10-CM

## 2023-02-12 DIAGNOSIS — R1030 Lower abdominal pain, unspecified: Secondary | ICD-10-CM | POA: Diagnosis not present

## 2023-02-12 DIAGNOSIS — N1330 Unspecified hydronephrosis: Secondary | ICD-10-CM | POA: Diagnosis not present

## 2023-02-12 DIAGNOSIS — R300 Dysuria: Secondary | ICD-10-CM | POA: Insufficient documentation

## 2023-02-12 LAB — COMPREHENSIVE METABOLIC PANEL
ALT: 18 U/L (ref 0–44)
AST: 19 U/L (ref 15–41)
Albumin: 4.1 g/dL (ref 3.5–5.0)
Alkaline Phosphatase: 75 U/L (ref 38–126)
Anion gap: 8 (ref 5–15)
BUN: 29 mg/dL — ABNORMAL HIGH (ref 8–23)
CO2: 21 mmol/L — ABNORMAL LOW (ref 22–32)
Calcium: 9.1 mg/dL (ref 8.9–10.3)
Chloride: 110 mmol/L (ref 98–111)
Creatinine, Ser: 1.3 mg/dL — ABNORMAL HIGH (ref 0.61–1.24)
GFR, Estimated: 53 mL/min — ABNORMAL LOW (ref >60)
Glucose, Bld: 119 mg/dL — ABNORMAL HIGH (ref 70–99)
Potassium: 4.3 mmol/L (ref 3.5–5.1)
Sodium: 139 mmol/L (ref 135–145)
Total Bilirubin: 0.9 mg/dL (ref 0.3–1.2)
Total Protein: 7.2 g/dL (ref 6.5–8.1)

## 2023-02-12 LAB — URINALYSIS, W/ REFLEX TO CULTURE (INFECTION SUSPECTED)
Bacteria, UA: NONE SEEN
Bilirubin Urine: NEGATIVE
Glucose, UA: NEGATIVE mg/dL
Hgb urine dipstick: NEGATIVE
Ketones, ur: NEGATIVE mg/dL
Leukocytes,Ua: NEGATIVE
Nitrite: POSITIVE — AB
Protein, ur: NEGATIVE mg/dL
Specific Gravity, Urine: 1.013 (ref 1.005–1.030)
Squamous Epithelial / HPF: 0 /HPF (ref 0–5)
pH: 5 (ref 5.0–8.0)

## 2023-02-12 LAB — CBC WITH DIFFERENTIAL/PLATELET
Abs Immature Granulocytes: 0.03 10*3/uL (ref 0.00–0.07)
Basophils Absolute: 0.1 10*3/uL (ref 0.0–0.1)
Basophils Relative: 1 %
Eosinophils Absolute: 1 10*3/uL — ABNORMAL HIGH (ref 0.0–0.5)
Eosinophils Relative: 10 %
HCT: 45.2 % (ref 39.0–52.0)
Hemoglobin: 15.2 g/dL (ref 13.0–17.0)
Immature Granulocytes: 0 %
Lymphocytes Relative: 22 %
Lymphs Abs: 2.1 10*3/uL (ref 0.7–4.0)
MCH: 31 pg (ref 26.0–34.0)
MCHC: 33.6 g/dL (ref 30.0–36.0)
MCV: 92.2 fL (ref 80.0–100.0)
Monocytes Absolute: 1 10*3/uL (ref 0.1–1.0)
Monocytes Relative: 10 %
Neutro Abs: 5.2 10*3/uL (ref 1.7–7.7)
Neutrophils Relative %: 57 %
Platelets: 220 10*3/uL (ref 150–400)
RBC: 4.9 MIL/uL (ref 4.22–5.81)
RDW: 12.6 % (ref 11.5–15.5)
WBC: 9.3 10*3/uL (ref 4.0–10.5)
nRBC: 0 % (ref 0.0–0.2)

## 2023-02-12 MED ORDER — FENTANYL CITRATE PF 50 MCG/ML IJ SOSY
50.0000 ug | PREFILLED_SYRINGE | Freq: Once | INTRAMUSCULAR | Status: AC
  Administered 2023-02-12: 50 ug via INTRAVENOUS
  Filled 2023-02-12: qty 1

## 2023-02-12 MED ORDER — ONDANSETRON HCL 4 MG/2ML IJ SOLN
4.0000 mg | Freq: Once | INTRAMUSCULAR | Status: AC
  Administered 2023-02-12: 4 mg via INTRAVENOUS
  Filled 2023-02-12: qty 2

## 2023-02-12 NOTE — ED Notes (Signed)
See triage note, pt reports lower abd pain and urinary sx worsening yesterday. NAD noted

## 2023-02-12 NOTE — ED Triage Notes (Signed)
Pt reports he has been having difficulty passing urine and lower abd pain since Sunday night. Pt reports now having lots of dribbling of urine into a depends but unsure when he has to urinate.

## 2023-02-12 NOTE — ED Provider Notes (Signed)
[facility] Provider Note    Event Date/Time   First MD Initiated Contact with Patient 02/12/23 [phone]     (approximate)   History   Chief Complaint: Dysuria   HPI  George Waller is a 89 y.o. male with a history of kidney stones and BPH who comes to the ED complaining of difficulty with urinating for the past 3 days.  He reports that he stopped passing urine a few days ago, but then over the last 24 hours he has had incontinence with urine dribbling frequently.  Complains of discomfort in the groin.  No abdominal pain or back pain.  No fever.     Physical Exam   Triage Vital Signs: ED Triage Vitals [02/12/23 0743]  Encounter Vitals Group     BP (!) 180/94     Systolic BP Percentile      Diastolic BP Percentile      Pulse Rate 60     Resp 18     Temp      Temp src      SpO2 99 %     Weight 149 lb 14.6 oz (68 kg)     Height 5\' 7"  (1.702 m)     Head Circumference      Peak Flow      Pain Score 10     Pain Loc      Pain Education      Exclude from Growth Chart     Most recent vital signs: Vitals:   02/12/23 0743 02/12/23 0915  BP: (!) 180/94   Pulse: 60   Resp: 18   Temp:  98.6 F (37 C)  SpO2: 99%     General: Awake, no distress.  CV:  Good peripheral perfusion.  Normal distal pulses Resp:  Normal effort.  Abd:  No distention.  Soft, nontender.  No CVA tenderness Other:  No lower extremity edema   ED Results / Procedures / Treatments   Labs (all labs ordered are listed, but only abnormal results are displayed) Labs Reviewed  COMPREHENSIVE METABOLIC PANEL - Abnormal; Notable for the following components:      Result Value   CO2 21 (*)    Glucose, Bld 119 (*)    BUN 29 (*)    Creatinine, Ser 1.30 (*)    GFR, Estimated 53 (*)    All other components within normal limits  CBC WITH DIFFERENTIAL/PLATELET - Abnormal; Notable for the following components:   Eosinophils Absolute 1.0 (*)    All other components within  normal limits  URINALYSIS, W/ REFLEX TO CULTURE (INFECTION SUSPECTED) - Abnormal; Notable for the following components:   Color, Urine AMBER (*)    APPearance CLEAR (*)    Nitrite POSITIVE (*)    All other components within normal limits     EKG    RADIOLOGY CT abdomen pelvis interpreted by me, shows markedly distended urinary bladder.  There is a kidney stone lying dependently in the posterior bladder.  Radiology report reviewed   PROCEDURES:  Procedures   MEDICATIONS ORDERED IN ED: Medications  ondansetron (ZOFRAN) injection 4 mg (4 mg Intravenous Given 02/12/23 0820)  fentaNYL (SUBLIMAZE) injection 50 mcg (50 mcg Intravenous Given 02/12/23 0818)     IMPRESSION / MDM / ASSESSMENT AND PLAN / ED COURSE  I reviewed the triage vital signs and the nursing notes.  DDx: UTI, urinary retention, ureterolithiasis, electrolyte abnormality, AKI, prostatic mass/hypertrophy  Patient's presentation is most consistent with acute presentation  with potential threat to life or bodily function.  Patient presents with decreased urine output and new dribbling incontinence.  Suspect overflow from urinary retention.  Will obtain CT abdomen pelvis and labs.   Clinical Course as of 02/12/23 1610  Wed Feb 12, 2023  0917 Nitrite(!): POSITIVE Nitrite + is due to spouse giving pt pyridium. No sign of infection.  [PS]    Clinical Course User Index [PS] Sharman Cheek, MD    ----------------------------------------- 9:20 AM on 02/12/2023 ----------------------------------------- Radiology report makes note of possible incarcerated inguinal hernia.  Reviewing the images he does have an indirect hernia on the left.  On exam, the area is soft, no or palpable mass or tenderness.  Patient does report he has had a hernia in that area for a long period of time and it does not bother him and is not painful, goes in and out.  Appears to have spontaneously reduced, no concern for incarceration  currently.   FINAL CLINICAL IMPRESSION(S) / ED DIAGNOSES   Final diagnoses:  Acute urinary retention     Rx / DC Orders   ED Discharge Orders     None        Note:  This document was prepared using Dragon voice recognition software and may include unintentional dictation errors.   Sharman Cheek, MD 02/12/23 [phone]

## 2023-02-12 NOTE — ED Notes (Signed)
Urine orange because Pt took Phenazopyridine HCL 99.5mg  . Wife gave it to him to help with urine output. Pt has Hx of Kidney stones wife says.

## 2023-02-20 ENCOUNTER — Ambulatory Visit (INDEPENDENT_AMBULATORY_CARE_PROVIDER_SITE_OTHER): Payer: Medicare Other | Admitting: Urology

## 2023-02-20 ENCOUNTER — Ambulatory Visit: Payer: Medicare Other | Admitting: Urology

## 2023-02-20 ENCOUNTER — Encounter: Payer: Self-pay | Admitting: Urology

## 2023-02-20 VITALS — BP 151/84 | HR 50 | Ht 67.0 in | Wt 149.7 lb

## 2023-02-20 DIAGNOSIS — N1330 Unspecified hydronephrosis: Secondary | ICD-10-CM | POA: Diagnosis not present

## 2023-02-20 DIAGNOSIS — N210 Calculus in bladder: Secondary | ICD-10-CM | POA: Diagnosis not present

## 2023-02-20 DIAGNOSIS — R338 Other retention of urine: Secondary | ICD-10-CM

## 2023-02-20 DIAGNOSIS — N401 Enlarged prostate with lower urinary tract symptoms: Secondary | ICD-10-CM | POA: Diagnosis not present

## 2023-02-20 MED ORDER — SILODOSIN 8 MG PO CAPS: 8.0000 mg | ORAL_CAPSULE | Freq: Every day | ORAL | 1 refills | Status: DC

## 2023-02-20 NOTE — Progress Notes (Signed)
Catheter Removal  Patient is present today for a catheter removal.  10ml of water was drained from the balloon. A 16FR foley cath was removed from the bladder, no complications were noted. Patient tolerated well.  Performed by: Ples Specter CMA  Follow up/ Additional notes: This afternoon
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
## 2023-02-20 NOTE — Progress Notes (Signed)
Simple Catheter Placement  Due to urinary retention patient is present today for a foley cath placement.  Patient was cleaned and prepped in a sterile fashion with betadine and 2% lidocaine jelly was instilled into the urethra. A 18 FR Coude foley catheter was inserted, urine return was noted  600 ml, urine was yellow clear in color.  The balloon was filled with 10cc of sterile water.  A leg bag was attached for drainage. Patient was also given a night bag to take home and was given instruction on how to change from one bag to another.  Patient was given instruction on proper catheter care.  Patient tolerated well, no complications were noted   Performed by: Michiel Cowboy, PA-C   Additional notes/ Follow up: Return in about 2 weeks (around 03/06/2023) for TOV/PVR .  He will start the Rapaflo 8 mg daily today.   He will likely need a HoLEP in the future.
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
## 2023-03-11 ENCOUNTER — Encounter: Payer: Self-pay | Admitting: Urology

## 2023-03-11 ENCOUNTER — Ambulatory Visit: Payer: Medicare Other | Admitting: Urology

## 2023-03-11 VITALS — BP 141/71 | HR 58 | Ht 70.0 in | Wt 147.0 lb

## 2023-03-11 DIAGNOSIS — N401 Enlarged prostate with lower urinary tract symptoms: Secondary | ICD-10-CM | POA: Diagnosis not present

## 2023-03-11 DIAGNOSIS — N210 Calculus in bladder: Secondary | ICD-10-CM | POA: Diagnosis not present

## 2023-03-11 DIAGNOSIS — R338 Other retention of urine: Secondary | ICD-10-CM

## 2023-03-11 LAB — BLADDER SCAN AMB NON-IMAGING: Scan Result: 84

## 2023-03-11 NOTE — Progress Notes (Signed)
Catheter Removal  Patient is present today for a catheter removal.  43m of water was drained from the balloon. A 18FR foley cath was removed from the bladder, no complications were noted. Patient tolerated well.  Performed by: JGaspar ColaCMA  Follow up/ Additional notes: This afternoon

## 2023-03-14 ENCOUNTER — Telehealth: Payer: Self-pay | Admitting: *Deleted

## 2023-03-14 NOTE — Telephone Encounter (Signed)
Spoke with George Waller and he doing better, they will watch him over the weekend and call if anything changes.

## 2023-03-14 NOTE — Telephone Encounter (Signed)
George Waller calling stating that pt is having burning and stinging while urinating. Pt is drinking plenty of water denies fever, abd pain or nausea. Please advise

## 2023-03-22 ENCOUNTER — Other Ambulatory Visit: Payer: Self-pay | Admitting: Urology

## 2023-03-28 ENCOUNTER — Ambulatory Visit: Payer: Medicare Other | Admitting: Urology

## 2023-03-28 ENCOUNTER — Telehealth: Payer: Self-pay | Admitting: *Deleted
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
## 2023-03-28 NOTE — Telephone Encounter (Signed)
Wife calling stating that every since pt had his catheter removed he's been having testicle pain and swelling. No fever, burning or urgency. Wife states it's not all the time and think the catheter has caused the problems? Pt has appt scheduled 04/15/23.

## 2023-03-28 NOTE — Telephone Encounter (Signed)
Scheduled appt.

## 2023-04-02 ENCOUNTER — Ambulatory Visit (INDEPENDENT_AMBULATORY_CARE_PROVIDER_SITE_OTHER): Payer: Medicare Other | Admitting: Urology

## 2023-04-02 ENCOUNTER — Encounter: Payer: Self-pay | Admitting: Urology

## 2023-04-02 VITALS — BP 134/75 | HR 54

## 2023-04-02 DIAGNOSIS — R339 Retention of urine, unspecified: Secondary | ICD-10-CM

## 2023-04-02 DIAGNOSIS — N138 Other obstructive and reflux uropathy: Secondary | ICD-10-CM

## 2023-04-02 DIAGNOSIS — N401 Enlarged prostate with lower urinary tract symptoms: Secondary | ICD-10-CM | POA: Diagnosis not present

## 2023-04-02 DIAGNOSIS — N5089 Other specified disorders of the male genital organs: Secondary | ICD-10-CM | POA: Diagnosis not present

## 2023-04-02 DIAGNOSIS — N210 Calculus in bladder: Secondary | ICD-10-CM

## 2023-04-02 LAB — MICROSCOPIC EXAMINATION

## 2023-04-02 LAB — URINALYSIS, COMPLETE
Bilirubin, UA: NEGATIVE
Glucose, UA: NEGATIVE
Ketones, UA: NEGATIVE
Nitrite, UA: NEGATIVE
Protein,UA: NEGATIVE
RBC, UA: NEGATIVE
Specific Gravity, UA: 1.025 (ref 1.005–1.030)
Urobilinogen, Ur: 0.2 mg/dL (ref 0.2–1.0)
pH, UA: 5.5 (ref 5.0–7.5)

## 2023-04-02 LAB — BLADDER SCAN AMB NON-IMAGING: Scan Result: 3

## 2023-04-08 ENCOUNTER — Ambulatory Visit
Admission: RE | Admit: 2023-04-08 | Discharge: 2023-04-08 | Disposition: A | Discharge status: Home / Self Care | Payer: Medicare Other | Source: Ambulatory Visit | Attending: Urology | Admitting: Urology

## 2023-04-08 DIAGNOSIS — N5089 Other specified disorders of the male genital organs: Secondary | ICD-10-CM | POA: Diagnosis present

## 2023-04-09 ENCOUNTER — Other Ambulatory Visit: Payer: Self-pay | Admitting: Urology

## 2023-04-09 DIAGNOSIS — N210 Calculus in bladder: Secondary | ICD-10-CM
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
## 2023-04-15 ENCOUNTER — Ambulatory Visit: Payer: Medicare Other | Admitting: Urology

## 2023-04-15 DIAGNOSIS — N5089 Other specified disorders of the male genital organs: Secondary | ICD-10-CM

## 2023-04-15 DIAGNOSIS — N138 Other obstructive and reflux uropathy: Secondary | ICD-10-CM

## 2023-04-15 DIAGNOSIS — N210 Calculus in bladder: Secondary | ICD-10-CM

## 2023-04-16 ENCOUNTER — Ambulatory Visit
Admission: RE | Admit: 2023-04-16 | Discharge: 2023-04-16 | Disposition: A | Discharge status: Home / Self Care | Payer: Medicare Other | Attending: Urology | Admitting: Urology

## 2023-04-16 ENCOUNTER — Ambulatory Visit: Payer: Medicare Other | Admitting: Urology

## 2023-04-16 ENCOUNTER — Encounter: Payer: Self-pay | Admitting: Urology

## 2023-04-16 ENCOUNTER — Ambulatory Visit
Admission: RE | Admit: 2023-04-16 | Discharge: 2023-04-16 | Disposition: A | Discharge status: Home / Self Care | Payer: Medicare Other | Source: Ambulatory Visit | Attending: Urology | Admitting: Urology

## 2023-04-16 VITALS — BP 161/76 | HR 47 | Ht 70.0 in | Wt 147.0 lb

## 2023-04-16 DIAGNOSIS — R338 Other retention of urine: Secondary | ICD-10-CM

## 2023-04-16 DIAGNOSIS — N401 Enlarged prostate with lower urinary tract symptoms: Secondary | ICD-10-CM | POA: Diagnosis not present

## 2023-04-16 DIAGNOSIS — R109 Unspecified abdominal pain: Secondary | ICD-10-CM | POA: Diagnosis not present

## 2023-04-16 DIAGNOSIS — N210 Calculus in bladder: Secondary | ICD-10-CM | POA: Insufficient documentation

## 2023-04-16 DIAGNOSIS — N5089 Other specified disorders of the male genital organs: Secondary | ICD-10-CM

## 2023-04-16 DIAGNOSIS — N138 Other obstructive and reflux uropathy: Secondary | ICD-10-CM

## 2023-04-16 LAB — BLADDER SCAN AMB NON-IMAGING: Scan Result: 48

## 2023-05-15 DIAGNOSIS — D2261 Melanocytic nevi of right upper limb, including shoulder: Secondary | ICD-10-CM | POA: Diagnosis not present

## 2023-05-15 DIAGNOSIS — L570 Actinic keratosis: Secondary | ICD-10-CM | POA: Diagnosis not present

## 2023-05-15 DIAGNOSIS — D2262 Melanocytic nevi of left upper limb, including shoulder: Secondary | ICD-10-CM | POA: Diagnosis not present

## 2023-05-15 DIAGNOSIS — L821 Other seborrheic keratosis: Secondary | ICD-10-CM | POA: Diagnosis not present

## 2023-05-15 DIAGNOSIS — D485 Neoplasm of uncertain behavior of skin: Secondary | ICD-10-CM | POA: Diagnosis not present

## 2023-05-15 DIAGNOSIS — D225 Melanocytic nevi of trunk: Secondary | ICD-10-CM | POA: Diagnosis not present

## 2023-05-15 DIAGNOSIS — L820 Inflamed seborrheic keratosis: Secondary | ICD-10-CM | POA: Diagnosis not present

## 2023-05-15 DIAGNOSIS — L905 Scar conditions and fibrosis of skin: Secondary | ICD-10-CM | POA: Diagnosis not present

## 2023-05-15 DIAGNOSIS — D0462 Carcinoma in situ of skin of left upper limb, including shoulder: Secondary | ICD-10-CM | POA: Diagnosis not present

## 2023-06-30 DIAGNOSIS — D0462 Carcinoma in situ of skin of left upper limb, including shoulder: Secondary | ICD-10-CM | POA: Diagnosis not present

## 2023-06-30 DIAGNOSIS — L570 Actinic keratosis: Secondary | ICD-10-CM | POA: Diagnosis not present

## 2023-07-25 ENCOUNTER — Other Ambulatory Visit: Payer: Self-pay | Admitting: Urology

## 2023-08-25 DIAGNOSIS — C44629 Squamous cell carcinoma of skin of left upper limb, including shoulder: Secondary | ICD-10-CM | POA: Diagnosis not present

## 2023-09-23 DIAGNOSIS — C44629 Squamous cell carcinoma of skin of left upper limb, including shoulder: Secondary | ICD-10-CM | POA: Diagnosis not present

## 2023-10-14 ENCOUNTER — Other Ambulatory Visit: Payer: Self-pay | Admitting: Urology

## 2023-10-14 DIAGNOSIS — N210 Calculus in bladder: Secondary | ICD-10-CM

## 2023-10-15 ENCOUNTER — Ambulatory Visit
Admission: RE | Admit: 2023-10-15 | Discharge: 2023-10-15 | Disposition: A | Discharge status: Home / Self Care | Source: Ambulatory Visit | Attending: Urology | Admitting: Urology

## 2023-10-15 ENCOUNTER — Ambulatory Visit: Payer: Self-pay | Admitting: Urology

## 2023-10-15 ENCOUNTER — Encounter: Payer: Self-pay | Admitting: Urology

## 2023-10-15 VITALS — BP 122/67 | HR 60 | Ht 70.0 in | Wt 147.0 lb

## 2023-10-15 DIAGNOSIS — N210 Calculus in bladder: Secondary | ICD-10-CM | POA: Diagnosis not present

## 2023-10-15 DIAGNOSIS — R351 Nocturia: Secondary | ICD-10-CM | POA: Diagnosis not present

## 2023-10-15 DIAGNOSIS — N138 Other obstructive and reflux uropathy: Secondary | ICD-10-CM | POA: Diagnosis not present

## 2023-10-15 DIAGNOSIS — I878 Other specified disorders of veins: Secondary | ICD-10-CM | POA: Diagnosis not present

## 2023-10-15 DIAGNOSIS — N401 Enlarged prostate with lower urinary tract symptoms: Secondary | ICD-10-CM | POA: Diagnosis not present

## 2023-10-15 DIAGNOSIS — R109 Unspecified abdominal pain: Secondary | ICD-10-CM | POA: Diagnosis not present

## 2023-10-15 LAB — BLADDER SCAN AMB NON-IMAGING: Scan Result: 0

## 2023-10-15 MED ORDER — SILODOSIN 8 MG PO CAPS: 8.0000 mg | ORAL_CAPSULE | Freq: Every day | ORAL | 3 refills | Status: AC
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
## 2023-11-18 DIAGNOSIS — H04123 Dry eye syndrome of bilateral lacrimal glands: Secondary | ICD-10-CM | POA: Diagnosis not present

## 2023-11-18 DIAGNOSIS — H5010 Unspecified exotropia: Secondary | ICD-10-CM | POA: Diagnosis not present

## 2023-11-18 DIAGNOSIS — H02135 Senile ectropion of left lower eyelid: Secondary | ICD-10-CM | POA: Diagnosis not present

## 2023-11-18 DIAGNOSIS — H25013 Cortical age-related cataract, bilateral: Secondary | ICD-10-CM | POA: Diagnosis not present

## 2023-11-18 DIAGNOSIS — H2513 Age-related nuclear cataract, bilateral: Secondary | ICD-10-CM | POA: Diagnosis not present

## 2023-12-12 DIAGNOSIS — L821 Other seborrheic keratosis: Secondary | ICD-10-CM | POA: Diagnosis not present

## 2023-12-12 DIAGNOSIS — D225 Melanocytic nevi of trunk: Secondary | ICD-10-CM | POA: Diagnosis not present

## 2023-12-12 DIAGNOSIS — L570 Actinic keratosis: Secondary | ICD-10-CM | POA: Diagnosis not present

## 2023-12-12 DIAGNOSIS — Z08 Encounter for follow-up examination after completed treatment for malignant neoplasm: Secondary | ICD-10-CM | POA: Diagnosis not present

## 2023-12-12 DIAGNOSIS — D2271 Melanocytic nevi of right lower limb, including hip: Secondary | ICD-10-CM | POA: Diagnosis not present

## 2023-12-12 DIAGNOSIS — D2272 Melanocytic nevi of left lower limb, including hip: Secondary | ICD-10-CM | POA: Diagnosis not present

## 2023-12-12 DIAGNOSIS — D2261 Melanocytic nevi of right upper limb, including shoulder: Secondary | ICD-10-CM | POA: Diagnosis not present

## 2023-12-12 DIAGNOSIS — Z85828 Personal history of other malignant neoplasm of skin: Secondary | ICD-10-CM | POA: Diagnosis not present

## 2023-12-12 DIAGNOSIS — D2262 Melanocytic nevi of left upper limb, including shoulder: Secondary | ICD-10-CM | POA: Diagnosis not present

## 2024-03-08 ENCOUNTER — Telehealth: Payer: Self-pay

## 2024-03-08 NOTE — Telephone Encounter (Signed)
 Pt SO called in with c/o blood in urine a couple times over the last few days. Denies any pain, fever or other UTI symptoms. I suggested pushing fluids. As well as a schedule them an appt within the week with a PA to F/U on this issue. Pts SO also said he was not on his Rapaflo  for a few days and wondered if that could have caused it.Pt is currently back on medication.
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
## 2024-03-11 ENCOUNTER — Encounter: Payer: Self-pay | Admitting: Urology

## 2024-03-11 ENCOUNTER — Ambulatory Visit: Admitting: Urology

## 2024-03-11 VITALS — BP 151/82 | HR 52 | Ht 68.0 in | Wt 150.0 lb

## 2024-03-11 DIAGNOSIS — N401 Enlarged prostate with lower urinary tract symptoms: Secondary | ICD-10-CM | POA: Diagnosis not present

## 2024-03-11 DIAGNOSIS — R310 Gross hematuria: Secondary | ICD-10-CM | POA: Diagnosis not present

## 2024-03-11 DIAGNOSIS — N138 Other obstructive and reflux uropathy: Secondary | ICD-10-CM

## 2024-03-11 DIAGNOSIS — N210 Calculus in bladder: Secondary | ICD-10-CM

## 2024-03-11 DIAGNOSIS — R351 Nocturia: Secondary | ICD-10-CM

## 2024-03-11 LAB — URINALYSIS, COMPLETE
Bilirubin, UA: NEGATIVE
Glucose, UA: NEGATIVE
Ketones, UA: NEGATIVE
Nitrite, UA: NEGATIVE
Protein,UA: NEGATIVE
Specific Gravity, UA: 1.02 (ref 1.005–1.030)
Urobilinogen, Ur: 0.2 mg/dL (ref 0.2–1.0)
pH, UA: 6 (ref 5.0–7.5)

## 2024-03-11 LAB — MICROSCOPIC EXAMINATION: WBC, UA: >30 /HPF — AB (ref 0–5)

## 2024-03-11 LAB — BLADDER SCAN AMB NON-IMAGING

## 2024-03-11 MED ORDER — CEFUROXIME AXETIL 250 MG PO TABS: 250.0000 mg | ORAL_TABLET | Freq: Two times a day (BID) | ORAL | 0 refills | Status: DC

## 2024-03-17 ENCOUNTER — Ambulatory Visit: Payer: Self-pay | Admitting: Urology

## 2024-03-17 ENCOUNTER — Other Ambulatory Visit: Payer: Self-pay

## 2024-03-17 LAB — CULTURE, URINE COMPREHENSIVE

## 2024-03-17 MED ORDER — AMOXICILLIN-POT CLAVULANATE 875-125 MG PO TABS: 1.0000 | ORAL_TABLET | Freq: Two times a day (BID) | ORAL | 0 refills | Status: DC

## 2024-03-17 NOTE — Progress Notes (Signed)
 Called patient and spoke with George Waller and she understood
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
## 2024-04-13 ENCOUNTER — Ambulatory Visit: Admitting: Urology

## 2024-04-13 DIAGNOSIS — R310 Gross hematuria: Secondary | ICD-10-CM

## 2024-04-13 DIAGNOSIS — N401 Enlarged prostate with lower urinary tract symptoms: Secondary | ICD-10-CM

## 2024-04-30 ENCOUNTER — Telehealth: Payer: Self-pay | Admitting: Urology

## 2024-04-30 NOTE — Telephone Encounter (Signed)
 Would you call George Waller and get him scheduled for a repeat UA?  He had micro heme and we need to make sure it resolved with the treatment of his infection.

## 2024-05-10 ENCOUNTER — Other Ambulatory Visit

## 2024-05-12 ENCOUNTER — Other Ambulatory Visit: Payer: Self-pay

## 2024-05-12 DIAGNOSIS — R310 Gross hematuria: Secondary | ICD-10-CM

## 2024-05-13 ENCOUNTER — Ambulatory Visit: Payer: Self-pay | Admitting: Urology

## 2024-05-13 ENCOUNTER — Other Ambulatory Visit

## 2024-05-13 DIAGNOSIS — R310 Gross hematuria: Secondary | ICD-10-CM

## 2024-05-13 LAB — URINALYSIS, COMPLETE
Bilirubin, UA: NEGATIVE
Glucose, UA: NEGATIVE
Ketones, UA: NEGATIVE
Leukocytes,UA: NEGATIVE
Nitrite, UA: NEGATIVE
Protein,UA: NEGATIVE
RBC, UA: NEGATIVE
Specific Gravity, UA: 1.03 (ref 1.005–1.030)
Urobilinogen, Ur: 0.2 mg/dL (ref 0.2–1.0)
pH, UA: 5.5 (ref 5.0–7.5)

## 2024-05-13 LAB — MICROSCOPIC EXAMINATION

## 2024-05-13 NOTE — Telephone Encounter (Signed)
 Attempted to reach patient in regards to urine results. VM was not set up, unable to leave message. Will make second attempt.  Andrea Kirks LPN

## 2024-05-13 NOTE — Telephone Encounter (Signed)
-----   Message from [facility] sent at 05/13/2024  3:04 PM EST ----- Please let George Waller know that his urine today did not show any blood.  This is great.  We will see him at his follow-up in May.

## 2024-05-18 ENCOUNTER — Encounter: Payer: Self-pay | Admitting: Internal Medicine

## 2024-05-18 ENCOUNTER — Ambulatory Visit: Admitting: Internal Medicine

## 2024-05-18 VITALS — BP 124/84 | HR 52 | Ht 68.0 in | Wt 159.0 lb

## 2024-05-18 DIAGNOSIS — R42 Dizziness and giddiness: Secondary | ICD-10-CM | POA: Insufficient documentation

## 2024-05-18 DIAGNOSIS — R7302 Impaired glucose tolerance (oral): Secondary | ICD-10-CM

## 2024-05-18 DIAGNOSIS — Z0000 Encounter for general adult medical examination without abnormal findings: Secondary | ICD-10-CM | POA: Insufficient documentation

## 2024-05-18 DIAGNOSIS — N1831 Chronic kidney disease, stage 3a: Secondary | ICD-10-CM | POA: Insufficient documentation

## 2024-05-18 DIAGNOSIS — N400 Enlarged prostate without lower urinary tract symptoms: Secondary | ICD-10-CM

## 2024-05-18 MED ORDER — FLUTICASONE PROPIONATE 50 MCG/ACT NA SUSP: 2.0000 | Freq: Every day | NASAL | 1 refills | Status: AC
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
## 2024-05-19 LAB — CBC WITH DIFFERENTIAL/PLATELET
Basophils Absolute: 0.1 x10E3/uL (ref 0.0–0.2)
Basos: 1 %
EOS (ABSOLUTE): 1.4 x10E3/uL — ABNORMAL HIGH (ref 0.0–0.4)
Eos: 15 %
Hematocrit: 48.7 % (ref 37.5–51.0)
Hemoglobin: 16 g/dL (ref 13.0–17.7)
Immature Grans (Abs): 0 x10E3/uL (ref 0.0–0.1)
Immature Granulocytes: 0 %
Lymphocytes Absolute: 2.6 x10E3/uL (ref 0.7–3.1)
Lymphs: 28 %
MCH: 30.9 pg (ref 26.6–33.0)
MCHC: 32.9 g/dL (ref 31.5–35.7)
MCV: 94 fL (ref 79–97)
Monocytes Absolute: 0.8 x10E3/uL (ref 0.1–0.9)
Monocytes: 8 %
Neutrophils Absolute: 4.3 x10E3/uL (ref 1.4–7.0)
Neutrophils: 48 %
Platelets: 211 x10E3/uL (ref 150–450)
RBC: 5.18 x10E6/uL (ref 4.14–5.80)
RDW: 11.9 % (ref 11.6–15.4)
WBC: 9.2 x10E3/uL (ref 3.4–10.8)

## 2024-05-19 LAB — LIPID PANEL W/O CHOL/HDL RATIO
Cholesterol, Total: 150 mg/dL (ref 100–199)
HDL: 35 mg/dL — ABNORMAL LOW (ref >39)
LDL Chol Calc (NIH): 99 mg/dL (ref 0–99)
Triglycerides: 86 mg/dL (ref 0–149)
VLDL Cholesterol Cal: 16 mg/dL (ref 5–40)

## 2024-05-19 LAB — CMP14+EGFR
ALT: 24 IU/L (ref 0–44)
AST: 28 IU/L (ref 0–40)
Albumin: 4.3 g/dL (ref 3.7–4.7)
Alkaline Phosphatase: 103 IU/L (ref 48–129)
BUN/Creatinine Ratio: 17 (ref 10–24)
BUN: 21 mg/dL (ref 8–27)
Bilirubin Total: 0.4 mg/dL (ref 0.0–1.2)
CO2: 17 mmol/L — ABNORMAL LOW (ref 20–29)
Calcium: 9.6 mg/dL (ref 8.6–10.2)
Chloride: 103 mmol/L (ref 96–106)
Creatinine, Ser: 1.22 mg/dL (ref 0.76–1.27)
Globulin, Total: 2.8 g/dL (ref 1.5–4.5)
Glucose: 99 mg/dL (ref 70–99)
Potassium: 4.8 mmol/L (ref 3.5–5.2)
Sodium: 138 mmol/L (ref 134–144)
Total Protein: 7.1 g/dL (ref 6.0–8.5)
eGFR: 57 mL/min/1.73 — ABNORMAL LOW (ref >59)

## 2024-05-19 LAB — HEMOGLOBIN A1C
Est. average glucose Bld gHb Est-mCnc: 143 mg/dL
Hgb A1c MFr Bld: 6.6 % — ABNORMAL HIGH (ref 4.8–5.6)

## 2024-05-20 ENCOUNTER — Ambulatory Visit: Payer: Self-pay | Admitting: Internal Medicine

## 2024-05-20 NOTE — Progress Notes (Signed)
 Patient notified

## 2024-06-01 ENCOUNTER — Ambulatory Visit: Admitting: Internal Medicine

## 2024-06-04 ENCOUNTER — Ambulatory Visit: Admitting: Internal Medicine

## 2024-06-04 ENCOUNTER — Encounter: Payer: Self-pay | Admitting: Internal Medicine

## 2024-06-04 VITALS — BP 118/82 | HR 68 | Ht 68.0 in | Wt 157.0 lb

## 2024-06-04 DIAGNOSIS — R7302 Impaired glucose tolerance (oral): Secondary | ICD-10-CM

## 2024-06-04 DIAGNOSIS — R7303 Prediabetes: Secondary | ICD-10-CM | POA: Diagnosis not present

## 2024-06-04 DIAGNOSIS — H65493 Other chronic nonsuppurative otitis media, bilateral: Secondary | ICD-10-CM

## 2024-06-04 DIAGNOSIS — N1831 Chronic kidney disease, stage 3a: Secondary | ICD-10-CM

## 2024-06-04 DIAGNOSIS — Z0130 Encounter for examination of blood pressure without abnormal findings: Secondary | ICD-10-CM
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
## 2024-09-20 ENCOUNTER — Ambulatory Visit: Admitting: Internal Medicine

## 2024-10-20 ENCOUNTER — Ambulatory Visit: Admitting: Urology
